# Patient Record
Sex: Female | Born: 1970
Health system: Southern US, Community
[De-identification: ages and names within clinical notes are randomized; demographics above are authoritative.]

## PROBLEM LIST (undated history)

## (undated) DIAGNOSIS — T8859XA Other complications of anesthesia, initial encounter: Secondary | ICD-10-CM

## (undated) DIAGNOSIS — Z9889 Other specified postprocedural states: Secondary | ICD-10-CM

## (undated) DIAGNOSIS — D242 Benign neoplasm of left breast: Secondary | ICD-10-CM

## (undated) DIAGNOSIS — S93439A Sprain of tibiofibular ligament of unspecified ankle, initial encounter: Secondary | ICD-10-CM

## (undated) DIAGNOSIS — O039 Complete or unspecified spontaneous abortion without complication: Secondary | ICD-10-CM

## (undated) DIAGNOSIS — T4145XA Adverse effect of unspecified anesthetic, initial encounter: Secondary | ICD-10-CM

## (undated) DIAGNOSIS — R112 Nausea with vomiting, unspecified: Secondary | ICD-10-CM

## (undated) DIAGNOSIS — M75102 Unspecified rotator cuff tear or rupture of left shoulder, not specified as traumatic: Secondary | ICD-10-CM

## (undated) DIAGNOSIS — N809 Endometriosis, unspecified: Secondary | ICD-10-CM

## (undated) DIAGNOSIS — N83209 Unspecified ovarian cyst, unspecified side: Secondary | ICD-10-CM

## (undated) DIAGNOSIS — T7840XA Allergy, unspecified, initial encounter: Secondary | ICD-10-CM

## (undated) HISTORY — PX: BREAST LUMPECTOMY: SHX2

## (undated) HISTORY — DX: Complete or unspecified spontaneous abortion without complication: O03.9

## (undated) HISTORY — DX: Benign neoplasm of left breast: D24.2

## (undated) HISTORY — DX: Endometriosis, unspecified: N80.9

## (undated) HISTORY — DX: Allergy, unspecified, initial encounter: T78.40XA

## (undated) HISTORY — DX: Unspecified ovarian cyst, unspecified side: N83.209

---

## 1992-08-23 HISTORY — PX: EXPLORATORY LAPAROTOMY: SUR591

## 1994-08-23 HISTORY — PX: OOPHORECTOMY: SHX86

## 1997-10-23 ENCOUNTER — Ambulatory Visit: Admission: RE | Admit: 1997-10-23 | Discharge: 1997-10-24 | Payer: Self-pay | Admitting: Obstetrics and Gynecology

## 1998-03-03 ENCOUNTER — Inpatient Hospital Stay (HOSPITAL_COMMUNITY): Admission: AD | Admit: 1998-03-03 | Discharge: 1998-03-03 | Payer: Self-pay | Admitting: Obstetrics & Gynecology

## 1998-10-02 ENCOUNTER — Ambulatory Visit (HOSPITAL_COMMUNITY): Admission: RE | Admit: 1998-10-02 | Discharge: 1998-10-02 | Payer: Self-pay | Admitting: Obstetrics and Gynecology

## 1998-11-24 ENCOUNTER — Other Ambulatory Visit: Admission: RE | Admit: 1998-11-24 | Discharge: 1998-11-24 | Payer: Self-pay | Admitting: Obstetrics and Gynecology

## 1999-10-09 ENCOUNTER — Other Ambulatory Visit: Admission: RE | Admit: 1999-10-09 | Discharge: 1999-10-09 | Payer: Self-pay | Admitting: Obstetrics and Gynecology

## 2000-02-18 ENCOUNTER — Inpatient Hospital Stay (HOSPITAL_COMMUNITY): Admission: AD | Admit: 2000-02-18 | Discharge: 2000-02-18 | Payer: Self-pay | Admitting: Obstetrics and Gynecology

## 2000-02-25 ENCOUNTER — Inpatient Hospital Stay (HOSPITAL_COMMUNITY): Admission: AD | Admit: 2000-02-25 | Discharge: 2000-02-25 | Payer: Self-pay | Admitting: Obstetrics & Gynecology

## 2000-03-03 ENCOUNTER — Inpatient Hospital Stay (HOSPITAL_COMMUNITY): Admission: AD | Admit: 2000-03-03 | Discharge: 2000-03-05 | Payer: Self-pay | Admitting: Obstetrics & Gynecology

## 2000-03-06 ENCOUNTER — Inpatient Hospital Stay (HOSPITAL_COMMUNITY): Admission: AD | Admit: 2000-03-06 | Discharge: 2000-03-06 | Payer: Self-pay | Admitting: Obstetrics and Gynecology

## 2000-03-10 ENCOUNTER — Encounter (INDEPENDENT_AMBULATORY_CARE_PROVIDER_SITE_OTHER): Payer: Self-pay

## 2000-03-10 ENCOUNTER — Inpatient Hospital Stay (HOSPITAL_COMMUNITY): Admission: AD | Admit: 2000-03-10 | Discharge: 2000-03-12 | Payer: Self-pay | Admitting: Obstetrics and Gynecology

## 2000-04-18 ENCOUNTER — Other Ambulatory Visit: Admission: RE | Admit: 2000-04-18 | Discharge: 2000-04-18 | Payer: Self-pay | Admitting: Obstetrics and Gynecology

## 2001-09-12 ENCOUNTER — Other Ambulatory Visit: Admission: RE | Admit: 2001-09-12 | Discharge: 2001-09-12 | Payer: Self-pay | Admitting: Obstetrics and Gynecology

## 2002-08-26 ENCOUNTER — Emergency Department (HOSPITAL_COMMUNITY): Admission: EM | Admit: 2002-08-26 | Discharge: 2002-08-26 | Payer: Self-pay | Admitting: *Deleted

## 2002-12-21 ENCOUNTER — Other Ambulatory Visit: Admission: RE | Admit: 2002-12-21 | Discharge: 2002-12-21 | Payer: Self-pay | Admitting: Obstetrics and Gynecology

## 2004-01-07 ENCOUNTER — Other Ambulatory Visit: Admission: RE | Admit: 2004-01-07 | Discharge: 2004-01-07 | Payer: Self-pay | Admitting: Obstetrics and Gynecology

## 2005-06-15 ENCOUNTER — Other Ambulatory Visit: Admission: RE | Admit: 2005-06-15 | Discharge: 2005-06-15 | Payer: Self-pay | Admitting: Obstetrics and Gynecology

## 2007-08-24 DIAGNOSIS — O039 Complete or unspecified spontaneous abortion without complication: Secondary | ICD-10-CM

## 2007-08-24 HISTORY — DX: Complete or unspecified spontaneous abortion without complication: O03.9

## 2007-12-05 ENCOUNTER — Ambulatory Visit (HOSPITAL_COMMUNITY): Admission: RE | Admit: 2007-12-05 | Discharge: 2007-12-05 | Payer: Self-pay | Admitting: Obstetrics and Gynecology

## 2007-12-05 ENCOUNTER — Encounter (INDEPENDENT_AMBULATORY_CARE_PROVIDER_SITE_OTHER): Payer: Self-pay | Admitting: Obstetrics and Gynecology

## 2008-03-19 ENCOUNTER — Inpatient Hospital Stay (HOSPITAL_COMMUNITY): Admission: AD | Admit: 2008-03-19 | Discharge: 2008-03-19 | Payer: Self-pay | Admitting: Obstetrics and Gynecology

## 2008-03-22 ENCOUNTER — Inpatient Hospital Stay (HOSPITAL_COMMUNITY): Admission: AD | Admit: 2008-03-22 | Discharge: 2008-03-22 | Payer: Self-pay | Admitting: Obstetrics and Gynecology

## 2008-03-25 ENCOUNTER — Inpatient Hospital Stay (HOSPITAL_COMMUNITY): Admission: AD | Admit: 2008-03-25 | Discharge: 2008-03-25 | Payer: Self-pay | Admitting: Obstetrics and Gynecology

## 2008-03-27 ENCOUNTER — Encounter (INDEPENDENT_AMBULATORY_CARE_PROVIDER_SITE_OTHER): Payer: Self-pay | Admitting: Obstetrics and Gynecology

## 2008-03-27 ENCOUNTER — Ambulatory Visit (HOSPITAL_COMMUNITY): Admission: RE | Admit: 2008-03-27 | Discharge: 2008-03-27 | Payer: Self-pay | Admitting: Obstetrics and Gynecology

## 2008-09-08 ENCOUNTER — Emergency Department (HOSPITAL_COMMUNITY): Admission: EM | Admit: 2008-09-08 | Discharge: 2008-09-08 | Payer: Self-pay | Admitting: Family Medicine

## 2010-08-23 HISTORY — PX: SHOULDER ARTHROSCOPY: SHX128

## 2011-01-05 NOTE — Op Note (Signed)
[name], Alexandra Mcguire              ACCOUNT NO.:  [id number]   MEDICAL RECORD NO.:  [id number]          PATIENT TYPE:  AMB   LOCATION:  SDC                           [facility]ILITY:  [facility]   PHYSICIAN:  Dineen Kid. Rana Snare, M.D.    DATE OF BIRTH:  [birth year]   DATE OF PROCEDURE:  12/05/2007  DATE OF DISCHARGE:                               OPERATIVE REPORT   PREOPERATIVE DIAGNOSIS:  Intrauterine pregnancy at six and a half weeks  gestational age with embryonic demise.   POSTOPERATIVE DIAGNOSIS:  Intrauterine pregnancy at six and a half weeks  gestational age with embryonic demise.   PROCEDURE:  Dilation and evacuation.   SURGEON:  Dr. Candice Camp.   ANESTHESIA:  Monitored anesthetic care and paracervical block.   INDICATIONS:  [name] is a 40 year old with an early pregnancy which  ultrasound last week showed irregular sac and slow heart rate.  Ultrasound yesterday shows embryonic demise measuring 6-1/2 weeks size.  She desires dilation and evacuation.  The risks and benefits of the  procedure were discussed at length.  Informed consent was obtained.  Her  blood type is O+.   DESCRIPTION OF PROCEDURE:  After adequate analgesia the patient was  placed in the dorsal lithotomy position.  She is sterilely prepped and  draped.  Bladder sterilely drained.  Graves speculum was placed.  Tenaculum placed in the anterior lip of the cervix.  Paracervical block  was placed with 1% Xylocaine and 1:100,000 epinephrine. A total of 20 mL  used.  The uterus sounded to 10 cm, easily dilated to a #29 Pratt  dilator. An 8 mm suction curette was inserted.  Products of conception  retrieved.  Curettage was performed until a gritty surface was felt  throughout the endometrial cavity and no further products of conception  being retrieved.  The patient received 0.2 mg of Methergine IM with good  uterine response.  Tenaculum removed from the cervix. Noted to be  hemostatic.  Speculum was then removed.  The patient  was transferred to  the recovery room in stable condition.  Sponge and instrument count was  normal x3.  Estimated blood loss was minimal.   DISPOSITION:  The patient will be discharged home.  She will follow-up  at the office in 2-3 weeks and had the  routine instruction sheet for  D&C.  Told to return for increased pain, fever or bleeding.  She is to  continue with Methergine 0.2 mg three times a day for 2 days and  doxycycline 100 mg p.o. b.i.d. times 7 days.      Dineen Kid Rana Snare, M.D.  Electronically Signed     DCL/MEDQ  D:  12/05/2007  T:  12/05/2007  Job:  [id number]

## 2011-01-05 NOTE — Op Note (Signed)
Alexandra Mcguire, Alexandra Mcguire              ACCOUNT NO.:  [id number]   MEDICAL RECORD NO.:  [id number]          PATIENT TYPE:  AMB   LOCATION:  SDC                           [facility]  [facility]   PHYSICIAN:  Guy Sandifer. Henderson Cloud, M.D. DATE OF BIRTH:  Jan 24, 1971   DATE OF PROCEDURE:  DATE OF DISCHARGE:                               OPERATIVE REPORT   PREOPERATIVE DIAGNOSIS:  Incomplete abortion.   POSTOPERATIVE DIAGNOSIS:  Incomplete abortion.   PROCEDURE:  Dilatation and evacuation.   SURGEON:  Guy Sandifer. Henderson Cloud, MD   ANESTHESIA:  MAC.   SPECIMENS:  Products of conception to pathology.   ESTIMATED BLOOD LOSS:  Minimal.   INDICATIONS AND CONSENT:  The patient is a 40 year old married white  female G4, P2 who had a recent miscarriage.  Details are dictated in  history and physical.  Dilatation and evacuation was discussed with the  patient.  Potential risks and complications were discussed  preoperatively including, but not limited to infection, uterine  perforation, organ damage, bleeding requiring transfusion of blood  products with possible HIV and hepatitis acquisition, DVT, PE, and  pneumonia.  All questions were answered and consent was signed on the  chart.   PROCEDURE:  The patient taken to the operating room where she was  identified, placed in dorsal supine position and given intravenous  sedation.  She was then placed in dorsal lithotomy position where she  was gently prepped, bladder straight catheterized and she was draped in  the sterile fashion.  Bivalve speculum was placed in the vagina.  The  anterior cervical lip was injected with 1% Xylocaine and grasped with  single-tooth tenaculum.  Paracervical block was placed at 2, 4, 5, 7, 8,  and 10 o'clock positions with approximately 20 mL total of 1% plain  Xylocaine.  Cervix was gently progressively dilated.  #7 curved curette  easily passes and suction curettage was carried out for minimal products  of conception.  Alternating  sharp and suction curettage was carried out.  The cavity was clean.  Good hemostasis was noted.  The patient was  awakened and taken to recovery room in stable condition.  All counts  were correct.  Blood type is O positive.  She will be discharged home on  Methergine 0.2 mg 1 p.o. t.i.d. for 6 doses.  Followup in the office in  2 weeks.      Guy Sandifer Henderson Cloud, M.D.  Electronically Signed     JET/MEDQ  D:  03/27/2008  T:  03/28/2008  Job:  [id number]

## 2011-01-05 NOTE — H&P (Signed)
NAMEKATALINA, Alexandra Mcguire              ACCOUNT NO.:  [id number]   MEDICAL RECORD NO.:  [id number]          PATIENT TYPE:  AMB   LOCATION:  SDC                           [facility]   PHYSICIAN:  Guy Sandifer. Henderson Cloud, M.D. DATE OF BIRTH:  30-Aug-1970   DATE OF ADMISSION:  DATE OF DISCHARGE:                              HISTORY & PHYSICAL   CHIEF COMPLAINT:  Incomplete abortion.   HISTORY OF PRESENT ILLNESS:  This patient is a 40 year old married white  female G4, P2 who had a plateaued quantitative hCG of approximately 12.  Ultrasound on February 28, 2008, was consistent with a probable small amount  of clot in the uterus.  However, her pregnancy test plateaued.  She was  then given methotrexate per protocol.  Her initial repeat was 117.  It  then went to 110 approximately 7 days later.  Repeat ultrasound at  [facility] on March 25, 2008, revealed a thickened endometrium of  3.2 cm with vascular flow, which was suggestive of retained products of  conception.  The options were discussed with the patient including a  second dose of methotrexate versus D&E.  The patient opts for surgical  intervention.  Potential risks and complications have been discussed  with the patient preoperatively.   PAST MEDICAL HISTORY:  Migraine headaches.   PAST SURGICAL HISTORY:  1. D&E.  2. Oophorectomy.  3. Laparoscopy for endometriosis.  4. Left breast lumpectomy.   OBSTETRIC HISTORY:  Cesarean section x1 and vaginal delivery x1.   MEDICATIONS:  Vitamins.   ALLERGIES:  SULFA DRUGS.   FAMILY HISTORY:  Positive for headaches, heart disease, seizure  disorder, diverticulosis, arthritis, and chronic hypertension.   SOCIAL HISTORY:  Denies tobacco, alcohol, or drug abuse.   REVIEW OF SYSTEMS:  NEUROLOGIC:  Denies headache.  CARDIO:  Denies chest  pain.  PULMONARY:  Denies shortness of breath.  GI:  Denies recent  changes in bowel habits.   PHYSICAL EXAMINATION:  VITAL SIGNS:  Height 5 feet 5 inches,  weight 159  pounds, and blood pressure 124/78.  HEENT:  Without thyromegaly.  LUNGS:  Clear to auscultation.  HEART:  Regular rate and rhythm.  ABDOMEN:  Soft and nontender without masses.  PELVIC:  Deferred.  EXTREMITIES:  Grossly within normal limits.  NEUROLOGICAL:  Grossly within normal limits.   LABORATORY DATA:  Blood type is O positive.   ASSESSMENT:  Incomplete abortion.   PLAN:  Dilatation and evacuation.      Guy Sandifer Henderson Cloud, M.D.  Electronically Signed     JET/MEDQ  D:  03/26/2008  T:  03/27/2008  Job:  [id number]

## 2011-01-08 NOTE — H&P (Signed)
[facility]  Patient:    Alexandra Mcguire, Alexandra Mcguire                       MRN: [id number] Adm. Date:  03/09/00 Attending:  Trevor Iha, M.D.                         History and Physical  DATE OF BIRTH:                1971-03-23  HISTORY OF PRESENT ILLNESS:   The patient is a 40 year old gravida 2, para 1 at 71-weeks estimated gestational age by an estimated date of confinement of April 20, 2000, who presents for cesarean section.  Her pregnancy has been complicated by fetal hydrocephalus.  She has been followed both by our office and also by the perinatologist at [facility].  Last ultrasound showed fetal head circumference of approximately 43 cm.  Dr. Jannetta Quint, perinatologist that has seen her most recently.  Recommended cesarean section for delivery.  He also recommended ________, preterm labor after 34 weeks.  She has been on terbutaline and bed rest for the last week due to preterm labor.  Cervix is 1 cm, 70% effaced.  She continues to contract four to five times an hour on the terbutaline, and now that she is 34 weeks, discontinue the terbutaline and will begin to labor.  Because of preterm labor beyond 34 weeks with recommendations not to stop fetal hydrocephalus compatible with the vaginal delivery, plan primary cesarean section.  Both the NICU at [facility] and [place] are ________.  The patient has had dexamethasone last week for fetal immaturity.  A group B Strep culture was negative on February 18, 2000.  PAST MEDICAL HISTORY:         Negative.  PAST SURGICAL HISTORY:        Significant for exploratory laparotomy for oophorectomy in 1996.  Laparoscopic _________ in 1994.  Minilaparotomy for right ovarian cyst in 1999 and a D&C in the year 2000.  PAST OB HISTORY:              Previous vaginal delivery, uncomplicated.  MEDICATIONS:                  Prenatal vitamins.  ALLERGIES:                    SULFA AND SHE IS SENSITIVE TO  MORPHINE.  PHYSICAL EXAMINATION:  VITAL SIGNS:                  Blood pressure is 110/80, heart regular rate and rhythm and clear to auscultation bilaterally.  ABDOMEN:                      Nondistended, nontender, gravid.  CERVIX:                       Is 1 cm, minus 3.  IMPRESSION AND PLAN:          Intrauterine pregnancy at 34-weeks, preterm labor.  Discontinue terbutaline.  The patient will continue to contract and because of progressive preterm labor, _______ with delivery fetal hydrocephalus, both NICUs are aware of this, and because of which, the fetal hydrocephalus and _________ at this point plan on delivery.  The patient also understands that products of conception may need to be _______ for infection for safe delivery of the  fetus.  She does give her informed consent. DD:  03/09/00 TD:  03/09/00 Job: [id number]

## 2011-01-08 NOTE — Discharge Summary (Signed)
[facility]  Patient:    Alexandra Mcguire, Alexandra Mcguire                     MRN: [id number] Adm. Date:  [id number] Disc. Date: [id number] Attending:  Trevor Iha Dictator:   Danie Chandler, R.N.                           Discharge Summary  ADMISSION DIAGNOSES:          1. Intrauterine pregnancy at [redacted] weeks gestation.                               2. Preterm labor.                               3. Fetal hydrocephalic.  DISCHARGE DIAGNOSES:          1. Intrauterine pregnancy at [redacted] weeks gestation.                               2. Preterm labor.                               3. Fetal hydrocephalic.  PROCEDURES:                   On March 10, 2000, primary low segment transverse cesarean section.  HISTORY OF PRESENT ILLNESS:   The patient is a 40 year old, gravida 2, para 1, at [redacted] weeks gestation.  The pregnancy has been complicated by fetal hydrocephalus, followed by perinatologist at [facility].  The last ultrasound last week showed a fetal head circumference of 43.0 cm.  A recommendation for cesarean section for delivery was made.  She subsequently was having preterm labor on tocolysis.  The cervical check was 1 cm dilated and 70% effaced.  The perinatologist recommended discontinuing the tocolysis at 34 weeks.  She continued to contract in labor.  Because of progressive preterm labor and fetal hydrocephalus, a primary cesarean section was planned.  HOSPITAL COURSE:              The patient was taken to the operating room and underwent the above-named procedure without complications.  This was productive of a viable female infant with fetal hydrocephalic with Apgars of 8 at one minute and 9 at five minutes.  Postoperatively, the patient did well. On postoperative day #1, the patients hemoglobin was 11.7, hematocrit 34.6, and white blood cell count 9.1.  She was ambulating well on this day and had good control with pain.  She had good return of bowel  function and was tolerating a regular diet.  The [facility] was stable at [facility].  DISPOSITION:                  On postoperative day #2, the patient was discharged home.  CONDITION ON DISCHARGE:       Good.  DIET:                         Regular as tolerated.  ACTIVITY:                     No heavy lifting,  no driving, and no vaginal entry.  FOLLOW-UP:                    She is to follow up in the office in one week. She is to call for temperature greater than 100 degrees, persistent nausea or vomiting, heavy vaginal bleeding, and/or redness or drainage from the incision site.  DISCHARGE MEDICATIONS: 1. Prenatal vitamins one p.o. q.d. 2. Tylox one to two p.o. q.4-6h. p.r.n. pain. DD:  03/30/00 TD:  03/31/00 Job: [id number]

## 2011-01-08 NOTE — Op Note (Signed)
[facility]  Patient:    Alexandra Mcguire, Alexandra Mcguire                     MRN: [id number] Proc. Date: 03/10/00 Adm. Date:  [id number] Attending:  Trevor Iha CC:         Trevor Iha, M.D.                           Operative Report  PREOPERATIVE DIAGNOSIS:       Intrauterine pregnancy at 34 weeks, preterm                               labor, fetal hydrocephalus.  POSTOPERATIVE DIAGNOSIS:      Intrauterine pregnancy at 34 weeks, preterm                               labor, fetal hydrocephalus.  PROCEDURE:                    Primary low segment transverse cesarean section.  SURGEON:                      Trevor Iha, M.D.  ASSISTANT:                    Raynald Kemp, M.D.  INDICATIONS:                  Ms. Fenn is a 40 year old G 2, P 1 at 34 weeks. (The pregnancy has been complicated by fetal hydrocephalus, followed by a perinatologist at [facility].  The last ultrasound last week showed a fetal head circumference of 43.0 cm.  Recommended a cesarean section for delivery.  She subsequently has been having preterm labor on tocolysis.  A cervical check was .0 cm, 70% effaced.  The perinatologist recommended discontinuing the tocolysis at 34 weeks.  She has continued to contract in labor.  Because of progressive preterm  labor and fetal hydrocephalus, plan a primary cesarean section.  Discussion of he risks and benefits of classical cesarean section versus low segment.  The patient gives her informed consent.  FINDINGS:                     A viable female infant with fetal hydrocephalus. We were able to deliver through a low segment transverse incision due to a well-developed lower segment.  Apgars were 8 and 9.  Weight was 7 pounds 11 ounces. The patient was stable to the neonatal intensive care unit.  The pH arterial was pending.  DESCRIPTION OF PROCEDURE:     After adequate analgesic the patient was placed in the supine  position with a left lateral tilt.  She was sterilely prepped and draped.  The Foley catheter was sterilely placed.  A Pfannenstiel skin incision was made through the previous incision, taken down sharply to the midline.  The fascia was transversely extended superiorly and inferiorly off the bellies of the rectus muscle, which were separated sharply in the midline.  The peritoneum was entered sharply.  The bladder flap was created and placed on the bladder blade.  The low segment was noted to be well-developed, due to the preterm labor and the fetal hydrocephalus.  A low semimyotomy incision was made to the  amniotic sac and extended laterally and the amniotomy was performed.  The infants vertex was easily delivered through the myotomy incision.  The nares and pharynx were suctioned. The infant was then easily delivered.  The cord was clamped, and the infant handed o the pediatricians.  The infant Apgars were 8 and 9.  The cord was clamped.  The  cord blood was then obtained and the placenta extracted manually.  The uterus was exteriorized and wiped clean with a dry lap.  The myotomy incision was closed in two layers, the first being a running locking layer, the second being an imbricating layer of #0 Monocryl.  Some bleeding over in the left inguinal, near the uterine artery was plicated with a Veress stitch, with care taken to avoid he ureters, with good hemostasis achieved.  There were some omental and epiploic adhesions to the right ovary which were sharply dissected with Bovie cautery. Hemostasis was achieved.  The uterus was placed back into the peritoneal cavity. After a copious amount of irrigated, adequate hemostasis was assured.  The peritoneum was closed with #0 Monocryl.  The rectus muscles were plicated in the midline.  Irrigation to the skin applied.  After adequate hemostasis, the fascia was closed with two sutures of #0 PDS in a running fashion.  The irrigation  was  applied after adequate hemostasis.  The skin was stapled and Steri-Strips applied.  The patient received 1 g of cefotetan after delivery of the placenta.  The infant was stable and transferred to the neonatal intensive care unit.  The estimated blood loss during the procedure was 800 cc. DD:  03/10/00 TD:  03/12/00 Job: [id number]

## 2011-01-08 NOTE — H&P (Signed)
Alexandra Mcguire  Patient:    Alexandra Mcguire, Alexandra Mcguire                     MRN: [id number] Adm. Date:  [id number] Attending:  Minette Headland                         History and Physical  HISTORY OF PRESENT ILLNESS:   Alexandra Mcguire is a 40 year old married female, gravida 2, para 1, at 33-2/[redacted] weeks gestation.  The patient is admitted for tocolysis due to preterm contractions.  The patient has been followed very carefully through our  office and through [facility] for fetal hydrocephaly.  She has been  stable to date, however, the patient did have an ultrasound on July 11, which revealed some worsening in the degree of hydrocephaly.  She is admitted today with contractions every eight minutes.  Cervical examination per nurse examiner was fingertip to 1 cm, 70% effacement, and -2 station.  She is admitted now for tocolysis of preterm contractions.  A consultation was carried out with the perineonatologist this morning regarding her care and tocolysis.  It is planned for this mother to have a cesarean delivery for her delivery and transfer the baby to [facility] where care will be undertaken  there for the baby.  Her recent Group B Strep status was negative.  PAST MEDICAL HISTORY:         None.  PAST SURGICAL HISTORY:        1) Exploratory laparotomy in 1996.  2) Laparoscopy for endometriosis x 1.  3) D&C x 1.  4) 1996 oophorectomy.  PAST OBSTETRIC HISTORY:       In 1997 a normal spontaneous vaginal delivery at term, female weighing 6 pounds 4 ounces without complications.  MEDICATIONS:                  1. Terbutaline.                               2. Prenatal vitamins.  ALLERGIES:                    No known drug allergies.  PHYSICAL EXAMINATION:  VITAL SIGNS:                  Stable, temperature 98.2, pulse 96, respirations 4, and blood pressure 131/73.  Fetal heart tones 140s and reassuring.  She does have frequent  contractions.  GENERAL:                      She is a well-developed, well-nourished female in no acute distress.  HEENT:                        Within normal limits.  NECK:                         Supple without adenopathy or thyromegaly.  HEART:                        Regular rate and rhythm without murmurs, rubs, or  gallops.  LUNGS:                        Clear to auscultation.  ABDOMEN:  Gravid and nontender.  EXTREMITIES: NEUROLOGICAL:    Grossly normal.  PELVIC:                       Cervical examination per nurse examiner fingertip to 1 cm, 60% effaced, and vertex presentation at -2 station.  ADMISSION DIAGNOSES:          1. Intrauterine pregnancy at 33-2/7 weeks.                               2. Preterm labor.                               3. Fetal hydrocephaly.  PLAN:                         1) IV magnesium sulfate tocolysis.  2) IV antibiotics.  3) Betamethasone x 2.  4) Maternal condition discussed with perineonatologist at [facility]. DD:  03/03/00 TD:  03/03/00 Job: [id number]

## 2011-04-15 ENCOUNTER — Other Ambulatory Visit (HOSPITAL_COMMUNITY): Payer: Self-pay | Admitting: Orthopedic Surgery

## 2011-04-15 DIAGNOSIS — M7540 Impingement syndrome of unspecified shoulder: Secondary | ICD-10-CM

## 2011-04-21 ENCOUNTER — Ambulatory Visit (HOSPITAL_COMMUNITY)
Admission: RE | Admit: 2011-04-21 | Discharge: 2011-04-21 | Disposition: A | Discharge status: Home / Self Care | Payer: 59 | Source: Ambulatory Visit | Attending: Orthopedic Surgery | Admitting: Orthopedic Surgery

## 2011-04-21 DIAGNOSIS — M25519 Pain in unspecified shoulder: Secondary | ICD-10-CM | POA: Insufficient documentation

## 2011-04-21 DIAGNOSIS — M7540 Impingement syndrome of unspecified shoulder: Secondary | ICD-10-CM

## 2011-04-21 DIAGNOSIS — M719 Bursopathy, unspecified: Secondary | ICD-10-CM | POA: Insufficient documentation

## 2011-04-21 DIAGNOSIS — M67919 Unspecified disorder of synovium and tendon, unspecified shoulder: Secondary | ICD-10-CM | POA: Insufficient documentation

## 2011-05-18 LAB — CBC
HCT: 40.8
Hemoglobin: 14.1
MCV: 95.8
Platelets: 251
RBC: 4.26
WBC: 8.6

## 2011-05-21 ENCOUNTER — Ambulatory Visit (HOSPITAL_BASED_OUTPATIENT_CLINIC_OR_DEPARTMENT_OTHER)
Admission: RE | Admit: 2011-05-21 | Discharge: 2011-05-21 | Disposition: A | Discharge status: Home / Self Care | Payer: 59 | Source: Ambulatory Visit | Attending: Orthopedic Surgery | Admitting: Orthopedic Surgery

## 2011-05-21 DIAGNOSIS — M719 Bursopathy, unspecified: Secondary | ICD-10-CM | POA: Insufficient documentation

## 2011-05-21 DIAGNOSIS — M67919 Unspecified disorder of synovium and tendon, unspecified shoulder: Secondary | ICD-10-CM | POA: Insufficient documentation

## 2011-05-21 DIAGNOSIS — M19019 Primary osteoarthritis, unspecified shoulder: Secondary | ICD-10-CM | POA: Insufficient documentation

## 2011-05-21 DIAGNOSIS — M25819 Other specified joint disorders, unspecified shoulder: Secondary | ICD-10-CM | POA: Insufficient documentation

## 2011-05-21 LAB — CBC
HCT: 38.7
Hemoglobin: 14.2
MCHC: 34.1
MCV: 98.5
Platelets: 201
RBC: 4.24
RBC: 3.93
WBC: 5.2
WBC: 6.9

## 2011-05-21 LAB — DIFFERENTIAL
Basophils Absolute: 0.1
Basophils Relative: 1
Lymphocytes Relative: 33
Monocytes Absolute: 0.5
Monocytes Relative: 9
Neutro Abs: 2.7
Neutrophils Relative %: 52

## 2011-05-21 LAB — CREATININE, SERUM: GFR calc Af Amer: >60

## 2011-05-21 LAB — HCG, QUANTITATIVE, PREGNANCY: hCG, Beta Chain, Quant, S: 110 — ABNORMAL HIGH

## 2011-05-24 LAB — POCT HEMOGLOBIN-HEMACUE: Hemoglobin: 15.4 g/dL — ABNORMAL HIGH (ref 12.0–15.0)

## 2011-05-28 NOTE — Op Note (Signed)
  Alexandra, Mcguire NO.:  [id number]  MEDICAL RECORD NO.:  [id number]  LOCATION:  [facility]                         FACILITY:  [facility]  PHYSICIAN:  Dyke Brackett, M.D.    DATE OF BIRTH:  1971/01/24  DATE OF PROCEDURE:  05/21/2011 DATE OF DISCHARGE:  04/21/2011                              OPERATIVE REPORT   INDICATIONS:  This is a 40 year old with severe shoulder pain, not responding to conservative treatment.  MRI demonstrating diffuse tendinopathy, severe impingement thought to be amenable to outpatient surgery.  PREOPERATIVE DIAGNOSES: 1. Severe tendinopathy, rotator cuff. 2. Degenerative tear in anterior-superior labrum. 3. Impingement. 4. Acromioclavicular joint arthritis.  POSTOPERATIVE DIAGNOSES: 1. Severe tendinopathy, rotator cuff. 2. Degenerative tear in anterior-superior labrum. 3. Impingement. 4. Acromioclavicular joint arthritis.  OPERATIONS: 1. Arthroscopic debridement, glenohumeral joint, right shoulder. 2. Arthroscopic acromioplasty, right shoulder. 3. Arthroscopic distal clavicle excision, right shoulder.  SURGEON:  Dyke Brackett, MD  ASSISTANT:  Margart Sickles, PA-C  ANESTHESIA:  General with a nerve block.  DESCRIPTION OF PROCEDURE:  She was examined under anesthesia.  She had normal range of motion.  She had no instability.  She was arthroscoped through posterolateral and anterior portal.  Systematic inspection of the shoulder showed diffuse tendinopathy involving all 3 tendons visualized with the cuff, subscapularis, supraspinatus, infraspinatus, and partial tearing of the supraspinatus was identified and debrided. The subscap showed an incomplete tear with partial tear of the anterior one-third.  We debrided this.  There still was attachment left though superiorly.  Biceps tendon anchor well-inflamed, did not show any overt instability.  We debrided the subscap.  We also had a degenerative tear in the anterior-superior labrum  which we debrided and there was no evidence of glenohumeral degenerative change.  Subacromial space was virtually inflamed.  Significant impingement was noted relative to the CA ligament and edge of the acromion.  We performed an acromioplasty resecting probably a total of 7-8 mm anterior ledge of the acromion.  We identified areas of impingement which we debrided on the cuff and also an arthritic AC joint, resecting approximately 1 cm of the distal clavicle.  This relieved the impingement nicely.  There was some tendinopathy type changes superiorly but no full-thickness tear was seen.  Bursectomy carried out, resection lines checked from the anterior and lateral approach.  Shoulder drained free of fluids, portals closed with nylon, no Marcaine was used in view of the block and sling was applied and taken to recovery room in stable condition.     Dyke Brackett, M.D.     WDC/MEDQ  D:  05/21/2011  T:  05/21/2011  Job:  [id number]  Electronically Signed by W. Texie Tupou M.D. on 05/28/2011 01:56:58 PM

## 2012-02-21 ENCOUNTER — Emergency Department (HOSPITAL_COMMUNITY): Payer: 59

## 2012-02-21 ENCOUNTER — Encounter (HOSPITAL_COMMUNITY): Payer: Self-pay | Admitting: Emergency Medicine

## 2012-02-21 ENCOUNTER — Ambulatory Visit (HOSPITAL_COMMUNITY)
Admission: EM | Admit: 2012-02-21 | Discharge: 2012-02-22 | Disposition: A | Discharge status: Home / Self Care | Payer: 59 | Source: Ambulatory Visit | Attending: Emergency Medicine | Admitting: Emergency Medicine

## 2012-02-21 ENCOUNTER — Encounter (HOSPITAL_COMMUNITY): Payer: Self-pay | Admitting: Anesthesiology

## 2012-02-21 ENCOUNTER — Encounter (HOSPITAL_COMMUNITY)
Admission: EM | Disposition: A | Discharge status: Home / Self Care | Payer: Self-pay | Source: Ambulatory Visit | Attending: Emergency Medicine

## 2012-02-21 ENCOUNTER — Emergency Department (HOSPITAL_COMMUNITY): Payer: 59 | Admitting: Anesthesiology

## 2012-02-21 DIAGNOSIS — S82409A Unspecified fracture of shaft of unspecified fibula, initial encounter for closed fracture: Secondary | ICD-10-CM | POA: Insufficient documentation

## 2012-02-21 DIAGNOSIS — Y998 Other external cause status: Secondary | ICD-10-CM | POA: Insufficient documentation

## 2012-02-21 DIAGNOSIS — W010XXA Fall on same level from slipping, tripping and stumbling without subsequent striking against object, initial encounter: Secondary | ICD-10-CM | POA: Insufficient documentation

## 2012-02-21 DIAGNOSIS — S9306XA Dislocation of unspecified ankle joint, initial encounter: Secondary | ICD-10-CM

## 2012-02-21 DIAGNOSIS — Y9302 Activity, running: Secondary | ICD-10-CM | POA: Insufficient documentation

## 2012-02-21 DIAGNOSIS — M25579 Pain in unspecified ankle and joints of unspecified foot: Secondary | ICD-10-CM | POA: Insufficient documentation

## 2012-02-21 DIAGNOSIS — S93439A Sprain of tibiofibular ligament of unspecified ankle, initial encounter: Secondary | ICD-10-CM | POA: Insufficient documentation

## 2012-02-21 HISTORY — PX: ORIF ANKLE FRACTURE: SHX5408

## 2012-02-21 HISTORY — DX: Sprain of tibiofibular ligament of unspecified ankle, initial encounter: S93.439A

## 2012-02-21 LAB — CARDIAC PANEL(CRET KIN+CKTOT+MB+TROPI)
CK, MB: 1.8 ng/mL (ref 0.3–4.0)
Relative Index: INVALID (ref 0.0–2.5)
Troponin I: <0.3 ng/mL (ref <0.30)

## 2012-02-21 SURGERY — OPEN REDUCTION INTERNAL FIXATION (ORIF) ANKLE FRACTURE
Anesthesia: General | Site: Ankle | Laterality: Right | Wound class: Clean

## 2012-02-21 MED ORDER — PROPOFOL 10 MG/ML IV EMUL
INTRAVENOUS | Status: AC | PRN
  Administered 2012-02-21: 6.5 mL via INTRAVENOUS

## 2012-02-21 MED ORDER — PROPOFOL 10 MG/ML IV EMUL
INTRAVENOUS | Status: DC | PRN
  Administered 2012-02-21: 120 mg via INTRAVENOUS

## 2012-02-21 MED ORDER — CALCIUM CARBONATE-VITAMIN D 500-200 MG-UNIT PO TABS
1.0000 | ORAL_TABLET | Freq: Every day | ORAL | Status: DC
  Administered 2012-02-22: 1 via ORAL
  Filled 2012-02-21 (×2): qty 1

## 2012-02-21 MED ORDER — ONDANSETRON HCL 4 MG/2ML IJ SOLN
4.0000 mg | Freq: Once | INTRAMUSCULAR | Status: DC
  Filled 2012-02-21: qty 2

## 2012-02-21 MED ORDER — SODIUM CHLORIDE 0.9 % IV BOLUS (SEPSIS)
1000.0000 mL | Freq: Once | INTRAVENOUS | Status: AC
  Administered 2012-02-21: 1000 mL via INTRAVENOUS

## 2012-02-21 MED ORDER — HYDROMORPHONE HCL PF 1 MG/ML IJ SOLN: 0.2500 mg | INTRAMUSCULAR | Status: DC | PRN

## 2012-02-21 MED ORDER — MEPERIDINE HCL 25 MG/ML IJ SOLN
6.2500 mg | INTRAMUSCULAR | Status: DC | PRN
  Administered 2012-02-21: 12.5 mg via INTRAVENOUS

## 2012-02-21 MED ORDER — HYDROMORPHONE HCL PF 1 MG/ML IJ SOLN
INTRAMUSCULAR | Status: AC
  Administered 2012-02-21: 1 mg via INTRAVENOUS
  Filled 2012-02-21: qty 1

## 2012-02-21 MED ORDER — LACTATED RINGERS IV SOLN
INTRAVENOUS | Status: DC | PRN
  Administered 2012-02-21: 19:00:00 via INTRAVENOUS

## 2012-02-21 MED ORDER — CEFAZOLIN SODIUM-DEXTROSE 2-3 GM-% IV SOLR
INTRAVENOUS | Status: AC
  Filled 2012-02-21: qty 50

## 2012-02-21 MED ORDER — FENTANYL CITRATE 0.05 MG/ML IJ SOLN
INTRAMUSCULAR | Status: DC | PRN
  Administered 2012-02-21 (×3): 50 ug via INTRAVENOUS
  Administered 2012-02-21: 100 ug via INTRAVENOUS

## 2012-02-21 MED ORDER — SENNA 8.6 MG PO TABS
1.0000 | ORAL_TABLET | Freq: Two times a day (BID) | ORAL | Status: DC
  Administered 2012-02-22 (×2): 8.6 mg via ORAL
  Filled 2012-02-21 (×3): qty 1

## 2012-02-21 MED ORDER — ONDANSETRON HCL 4 MG/2ML IJ SOLN
INTRAMUSCULAR | Status: DC | PRN
  Administered 2012-02-21: 4 mg via INTRAVENOUS

## 2012-02-21 MED ORDER — METHOCARBAMOL 100 MG/ML IJ SOLN
500.0000 mg | Freq: Four times a day (QID) | INTRAVENOUS | Status: DC | PRN
  Filled 2012-02-21: qty 5

## 2012-02-21 MED ORDER — ADULT MULTIVITAMIN W/MINERALS CH
1.0000 | ORAL_TABLET | Freq: Every day | ORAL | Status: DC
  Administered 2012-02-22: 1 via ORAL
  Filled 2012-02-21 (×2): qty 1

## 2012-02-21 MED ORDER — FENTANYL CITRATE 0.05 MG/ML IJ SOLN
100.0000 ug | Freq: Once | INTRAMUSCULAR | Status: AC
  Administered 2012-02-21: 100 ug via INTRAVENOUS
  Filled 2012-02-21: qty 2

## 2012-02-21 MED ORDER — PROPOFOL 10 MG/ML IV BOLUS
0.5000 mg/kg | Freq: Once | INTRAVENOUS | Status: AC
  Administered 2012-02-21: 65 mg via INTRAVENOUS
  Filled 2012-02-21: qty 3.45

## 2012-02-21 MED ORDER — LORATADINE-PSEUDOEPHEDRINE ER 5-120 MG PO TB12: 1.0000 | ORAL_TABLET | Freq: Two times a day (BID) | ORAL | Status: DC | PRN

## 2012-02-21 MED ORDER — LIDOCAINE HCL (CARDIAC) 20 MG/ML IV SOLN
INTRAVENOUS | Status: DC | PRN
  Administered 2012-02-21: 100 mg via INTRAVENOUS

## 2012-02-21 MED ORDER — HYDROMORPHONE HCL PF 1 MG/ML IJ SOLN
1.0000 mg | Freq: Once | INTRAMUSCULAR | Status: AC
  Administered 2012-02-21: 1 mg via INTRAVENOUS
  Filled 2012-02-21: qty 1

## 2012-02-21 MED ORDER — HYDROMORPHONE HCL PF 1 MG/ML IJ SOLN
0.5000 mg | INTRAMUSCULAR | Status: DC | PRN
  Administered 2012-02-22: 1 mg via INTRAVENOUS
  Filled 2012-02-21: qty 1

## 2012-02-21 MED ORDER — PROMETHAZINE HCL 25 MG PO TABS: 25.0000 mg | ORAL_TABLET | Freq: Four times a day (QID) | ORAL | Status: DC | PRN

## 2012-02-21 MED ORDER — ONDANSETRON HCL 4 MG/2ML IJ SOLN: 4.0000 mg | Freq: Four times a day (QID) | INTRAMUSCULAR | Status: DC | PRN

## 2012-02-21 MED ORDER — ONDANSETRON HCL 4 MG/2ML IJ SOLN
4.0000 mg | Freq: Once | INTRAMUSCULAR | Status: AC
  Administered 2012-02-21: 4 mg via INTRAVENOUS

## 2012-02-21 MED ORDER — DEXAMETHASONE SODIUM PHOSPHATE 10 MG/ML IJ SOLN
INTRAMUSCULAR | Status: DC | PRN
  Administered 2012-02-21: 8 mg via INTRAVENOUS

## 2012-02-21 MED ORDER — CEFAZOLIN SODIUM-DEXTROSE 2-3 GM-% IV SOLR
2.0000 g | INTRAVENOUS | Status: AC
  Administered 2012-02-21: 2 g via INTRAVENOUS

## 2012-02-21 MED ORDER — MEPERIDINE HCL 25 MG/ML IJ SOLN
INTRAMUSCULAR | Status: AC
  Filled 2012-02-21: qty 1

## 2012-02-21 MED ORDER — ONDANSETRON HCL 4 MG PO TABS: 4.0000 mg | ORAL_TABLET | Freq: Four times a day (QID) | ORAL | Status: DC | PRN

## 2012-02-21 MED ORDER — SCOPOLAMINE 1 MG/3DAYS TD PT72
MEDICATED_PATCH | TRANSDERMAL | Status: DC | PRN
  Administered 2012-02-21: 1 via TRANSDERMAL

## 2012-02-21 MED ORDER — SUCCINYLCHOLINE CHLORIDE 20 MG/ML IJ SOLN
INTRAMUSCULAR | Status: DC | PRN
  Administered 2012-02-21: 100 mg via INTRAVENOUS

## 2012-02-21 MED ORDER — SODIUM CHLORIDE 0.9 % IV SOLN
INTRAVENOUS | Status: DC | PRN
  Administered 2012-02-21: 18:00:00 via INTRAVENOUS

## 2012-02-21 MED ORDER — METHOCARBAMOL 500 MG PO TABS
500.0000 mg | ORAL_TABLET | Freq: Four times a day (QID) | ORAL | Status: DC | PRN
  Administered 2012-02-22: 500 mg via ORAL
  Filled 2012-02-21 (×2): qty 1

## 2012-02-21 MED ORDER — OXYCODONE-ACETAMINOPHEN 5-325 MG PO TABS
1.0000 | ORAL_TABLET | ORAL | Status: DC | PRN
  Administered 2012-02-22: 1 via ORAL
  Filled 2012-02-21: qty 1

## 2012-02-21 MED ORDER — OXYCODONE HCL 5 MG PO TABS: 5.0000 mg | ORAL_TABLET | ORAL | Status: DC | PRN

## 2012-02-21 MED ORDER — DOCUSATE SODIUM 100 MG PO CAPS
100.0000 mg | ORAL_CAPSULE | Freq: Two times a day (BID) | ORAL | Status: DC
  Administered 2012-02-22 (×2): 100 mg via ORAL
  Filled 2012-02-21 (×3): qty 1

## 2012-02-21 MED ORDER — ONDANSETRON HCL 4 MG/2ML IJ SOLN
INTRAMUSCULAR | Status: AC
  Administered 2012-02-21: 4 mg via INTRAVENOUS
  Filled 2012-02-21: qty 2

## 2012-02-21 MED ORDER — METOCLOPRAMIDE HCL 5 MG/ML IJ SOLN: 5.0000 mg | Freq: Three times a day (TID) | INTRAMUSCULAR | Status: DC | PRN

## 2012-02-21 MED ORDER — MIDAZOLAM HCL 5 MG/5ML IJ SOLN
INTRAMUSCULAR | Status: DC | PRN
  Administered 2012-02-21: 2 mg via INTRAVENOUS

## 2012-02-21 MED ORDER — LORATADINE 10 MG PO TABS
10.0000 mg | ORAL_TABLET | Freq: Every day | ORAL | Status: DC
  Filled 2012-02-21 (×2): qty 1

## 2012-02-21 MED ORDER — ONDANSETRON HCL 4 MG/2ML IJ SOLN: 4.0000 mg | Freq: Once | INTRAMUSCULAR | Status: DC | PRN

## 2012-02-21 MED ORDER — SODIUM CHLORIDE 0.9 % IV SOLN: INTRAVENOUS | Status: DC

## 2012-02-21 MED ORDER — ZOLPIDEM TARTRATE 5 MG PO TABS: 5.0000 mg | ORAL_TABLET | Freq: Every evening | ORAL | Status: DC | PRN

## 2012-02-21 MED ORDER — OXYCODONE-ACETAMINOPHEN 10-325 MG PO TABS: 1.0000 | ORAL_TABLET | Freq: Four times a day (QID) | ORAL | Status: AC | PRN

## 2012-02-21 MED ORDER — BUPIVACAINE-EPINEPHRINE PF 0.5-1:200000 % IJ SOLN
INTRAMUSCULAR | Status: DC | PRN
  Administered 2012-02-21: 30 mL

## 2012-02-21 MED ORDER — PSEUDOEPHEDRINE HCL ER 120 MG PO TB12
120.0000 mg | ORAL_TABLET | Freq: Two times a day (BID) | ORAL | Status: DC
  Filled 2012-02-21 (×3): qty 1

## 2012-02-21 MED ORDER — METOCLOPRAMIDE HCL 10 MG PO TABS: 5.0000 mg | ORAL_TABLET | Freq: Three times a day (TID) | ORAL | Status: DC | PRN

## 2012-02-21 MED ORDER — CEFAZOLIN SODIUM 1-5 GM-% IV SOLN
1.0000 g | Freq: Four times a day (QID) | INTRAVENOUS | Status: AC
  Administered 2012-02-21 – 2012-02-22 (×3): 1 g via INTRAVENOUS
  Filled 2012-02-21 (×3): qty 50

## 2012-02-21 SURGICAL SUPPLY — 53 items
APL SKNCLS STERI-STRIP NONHPOA (GAUZE/BANDAGES/DRESSINGS) ×1
BANDAGE ELASTIC 4 VELCRO ST LF (GAUZE/BANDAGES/DRESSINGS) ×1 IMPLANT
BANDAGE ELASTIC 6 VELCRO ST LF (GAUZE/BANDAGES/DRESSINGS) ×4 IMPLANT
BANDAGE ESMARK 6X9 LF (GAUZE/BANDAGES/DRESSINGS) IMPLANT
BANDAGE GAUZE ELAST BULKY 4 IN (GAUZE/BANDAGES/DRESSINGS) ×1 IMPLANT
BENZOIN TINCTURE PRP APPL 2/3 (GAUZE/BANDAGES/DRESSINGS) ×1 IMPLANT
BIT DRILL 2.5X110 QC LCP DISP (BIT) ×1 IMPLANT
BNDG CMPR 9X6 STRL LF SNTH (GAUZE/BANDAGES/DRESSINGS)
BNDG COHESIVE 3X5 TAN STRL LF (GAUZE/BANDAGES/DRESSINGS) ×1 IMPLANT
BNDG ESMARK 6X9 LF (GAUZE/BANDAGES/DRESSINGS)
BOOTCOVER CLEANROOM LRG (PROTECTIVE WEAR) ×4 IMPLANT
CLOTH BEACON ORANGE TIMEOUT ST (SAFETY) ×2 IMPLANT
CLSR STERI-STRIP ANTIMIC 1/2X4 (GAUZE/BANDAGES/DRESSINGS) ×1 IMPLANT
COVER SURGICAL LIGHT HANDLE (MISCELLANEOUS) ×2 IMPLANT
CUFF TOURNIQUET SINGLE 34IN LL (TOURNIQUET CUFF) IMPLANT
CUFF TOURNIQUET SINGLE 44IN (TOURNIQUET CUFF) IMPLANT
DECANTER SPIKE VIAL GLASS SM (MISCELLANEOUS) IMPLANT
DRAPE C-ARM 42X72 X-RAY (DRAPES) IMPLANT
DRAPE OEC MINIVIEW 54X84 (DRAPES) ×1 IMPLANT
DRAPE U-SHAPE 47X51 STRL (DRAPES) IMPLANT
DRSG PAD ABDOMINAL 8X10 ST (GAUZE/BANDAGES/DRESSINGS) ×2 IMPLANT
DURAPREP 26ML APPLICATOR (WOUND CARE) ×2 IMPLANT
ELECT REM PT RETURN 9FT ADLT (ELECTROSURGICAL) ×2
ELECTRODE REM PT RTRN 9FT ADLT (ELECTROSURGICAL) ×1 IMPLANT
GAUZE XEROFORM 1X8 LF (GAUZE/BANDAGES/DRESSINGS) ×4 IMPLANT
GLOVE BIOGEL PI IND STRL 8 (GLOVE) ×1 IMPLANT
GLOVE BIOGEL PI INDICATOR 8 (GLOVE) ×1
GLOVE ORTHO TXT STRL SZ7.5 (GLOVE) ×2 IMPLANT
GLOVE SURG ORTHO 8.0 STRL STRW (GLOVE) ×4 IMPLANT
GOWN STRL REIN XL XLG (GOWN DISPOSABLE) ×4 IMPLANT
KIT 1/3 TUB PL 5H 61M (Orthopedic Implant) IMPLANT
KIT BASIN OR (CUSTOM PROCEDURE TRAY) ×2 IMPLANT
KIT ROOM TURNOVER OR (KITS) ×2 IMPLANT
MANIFOLD NEPTUNE II (INSTRUMENTS) ×2 IMPLANT
NS IRRIG 1000ML POUR BTL (IV SOLUTION) ×2 IMPLANT
PACK ORTHO EXTREMITY (CUSTOM PROCEDURE TRAY) ×2 IMPLANT
PAD ARMBOARD 7.5X6 YLW CONV (MISCELLANEOUS) ×4 IMPLANT
PAD CAST 4YDX4 CTTN HI CHSV (CAST SUPPLIES) ×2 IMPLANT
PADDING CAST COTTON 4X4 STRL (CAST SUPPLIES) ×2
PROS 1/3 TUB PL 5H 61M (Orthopedic Implant) ×2 IMPLANT
SCREW CORTEX 3.5 50MM (Screw) ×1 IMPLANT
SPONGE GAUZE 4X4 12PLY (GAUZE/BANDAGES/DRESSINGS) ×2 IMPLANT
SPONGE LAP 4X18 X RAY DECT (DISPOSABLE) ×4 IMPLANT
STAPLER VISISTAT 35W (STAPLE) ×2 IMPLANT
SUCTION FRAZIER TIP 10 FR DISP (SUCTIONS) ×2 IMPLANT
SUT VIC AB 3-0 SH 8-18 (SUTURE) ×1 IMPLANT
SYR CONTROL 10ML LL (SYRINGE) ×1 IMPLANT
TOWEL OR 17X24 6PK STRL BLUE (TOWEL DISPOSABLE) ×2 IMPLANT
TOWEL OR 17X26 10 PK STRL BLUE (TOWEL DISPOSABLE) ×2 IMPLANT
TUBE CONNECTING 12X1/4 (SUCTIONS) ×2 IMPLANT
UNDERPAD 30X30 INCONTINENT (UNDERPADS AND DIAPERS) ×2 IMPLANT
WATER STERILE IRR 1000ML POUR (IV SOLUTION) ×2 IMPLANT
YANKAUER SUCT BULB TIP NO VENT (SUCTIONS) ×1 IMPLANT

## 2012-02-21 NOTE — ED Notes (Signed)
Sedation end. Pt arouses to calling. VSS

## 2012-02-21 NOTE — Op Note (Signed)
02/21/2012  8:09 PM  PATIENT:  Alexandra Mcguire    PRE-OPERATIVE DIAGNOSIS:  right ankle dislocation  POST-OPERATIVE DIAGNOSIS:  Same  PROCEDURE:  OPEN REDUCTION INTERNAL FIXATION (ORIF) ANKLE SYNDESMOSIS  SURGEON:  Eulas Post, MD  PHYSICIAN ASSISTANT: Janace Litten, OPA-C, present and scrubbed throughout the case, critical for completion in a timely fashion, and for retraction, instrumentation, and closure.  ANESTHESIA:   General  PREOPERATIVE INDICATIONS:  Alexandra Mcguire is a  41 y.o. female with a diagnosis of right ankle dislocation who elected for surgical management.  She had skin tenting, and nearly an open fracture.  The risks benefits and alternatives were discussed with the patient preoperatively including but not limited to the risks of infection, bleeding, nerve injury, cardiopulmonary complications, the need for revision surgery, among others, and the patient was willing to proceed.  OPERATIVE IMPLANTS: Synthes 2 hole one third tubular plate, cut down from a 5 hole plate, with 2  3.5 mm cortical screws, placed across 3 cortices  OPERATIVE FINDINGS: Disrupted syndesmosis with widening of the medial clear space and distal tibiofibular overlap. The fibula was out to length, compared to the contralateral side.  OPERATIVE PROCEDURE: The patient was brought to the operating room and placed in supine position. General anesthesia was administered. Regional block and given. Time out was performed. The right lower extremity was prepped and draped in usual sterile fashion. Time out was performed. The leg was elevated and exsanguinated and the tourniquet was inflated. I did use the C-arm to take an x-ray of the contralateral side for comparison. This was prior to prepping and draping.  I made a small incision over the distal fibula, and exposed the bone. I applied a largeclamp, reducing the syndesmosis anatomically. I then cut a small plate down to 2 holes, because a 2 hole plate  was not available, and then filed off the ends in order that there were no sharp ends. I then placed a distal cortical screw across 3 cortices, and then placed a second one for back up given the severe ligamentous damage. I then tested the syndesmosis, which was stable. I then irrigated the wounds copiously, and repaired the subcutaneous tissue with Vicryl followed by Steri-Strips and sterile gauze for the skin. Final C-arm pictures were taken. A posterior splint was applied. She was awakened and returned to the PACU in stable and satisfactory condition. There were no complications and she tolerated the procedure well.

## 2012-02-21 NOTE — ED Notes (Signed)
Pt was running on a wet trail when she slipped and snapped her right ankle. Obvious deformity. Distal circulation intact. 20g l hand, fentanyl PTA

## 2012-02-21 NOTE — Discharge Instructions (Signed)
Ankle Dislocation Ankle dislocation is the displacement of the bones that form your ankle joint. The ankle joint is designed for a balance of stability and flexibility. The bones of the ankle are held in place by very strong, fibrous tissues (ligaments) that connect the bones to each other. CAUSES Because the ankle is a very strong and stable joint, ankle dislocation is only caused by a very forceful injury. Typically, injuries that contribute to ankle dislocation include broken bones (fractures) on the inside and outside of the ankle (malleoli).  RELATED COMPLICATIONS Ankle dislocation can lead to more serious complications. Examples of complications associated with ankle dislocation include:  Injury to the strong fibrous tissues that connect muscles to bones (tendons).   Injury to the flexible tissue that cushions the bones in the joint (cartilage). This can lead to the development of arthritis, loss of joint motion, and pain.   Injury to the nerves and blood vessels that cross the ankle. Blood vessel damage may result in bone death of the top bone of the foot (talus).   Skin over the dislocated area being torn (lacerated) or damaged by pressure from the dislocated bones.   Swelling of compartments in the foot (rare). This may damage blood supply to the muscles (compartment syndrome).  RISK FACTORS Although dislocation of the ankle can occur in anyone, some people are at greater risk than others. People at increased risk of ankle dislocation include:  Young males. This may be related to their overall increased risk of injury.   Postmenopausal women. This may be related to their increased risk of bone fracture because of the weakening of the bones that occurs in women in this age group (osteoporosis).   People born with greater looseness (elasticity) in their ligaments.  SYMPTOMS Symptoms of ankle dislocation include:  Severe pain.   Swelling.   Deformity around the ankle.    Whitening or laceration of the skin.  DIAGNOSIS  A physical exam and an X-ray exam are usually done to help your caregiver diagnose ankle dislocation. TREATMENT Treatment may include:  Manipulation of the ankle by your caregiver to put your ankle back in place (reduction).   Repair of any associated skin lacerations.   Plates and screws used to stabilize the fractures and hold the joint in position after reduction.   Pins drilled into your bones that are connected to bars outside of your skin (external fixator) used to hold your ankle in a fixed position until the swelling in your ankle goes down enough for surgery to be done.   Placement of a cast or splint to allow torn ligaments to heal.   Physical therapy to regain ankle motion and leg strength.  HOME CARE INSTRUCTIONS The following measures can help to reduce pain and hasten the healing process:  Rest your injured joint. Do not move it. Avoid activities similar to the one that caused your injury.   Apply ice to your injured joint for 1 to 2 days after your reduction or as directed by your caregiver. Applying ice helps to reduce inflammation and pain.   Put ice in a plastic bag.   Place a towel between your skin and the bag.   Leave the ice on for 15 to 20 minutes at a time, every couple of hours while you are awake.   Elevate your ankle above your heart to minimize swelling.   Move your toes as instructed by your caregiver to prevent stiffness.   Take over-the-counter or prescription medicines for  pain as directed by your caregiver.  SEEK IMMEDIATE MEDICAL CARE IF:  Your cast, splint, screws, plates, or external fixator becomes loose or damaged.   You have an external fixator and you notice fluids draining around the pins.   Your pain becomes worse rather than better.   You lose feeling in your toe or cannot bend the tip of your toe.  MAKE SURE YOU:  Understand these instructions.   Will watch your condition.    Will get help right away if you are not doing well or get worse.  Document Released: 08/09/2005 Document Revised: 07/29/2011 Document Reviewed: 01/07/2011 [facility] Patient Information 2012 [place], [place].

## 2012-02-21 NOTE — ED Provider Notes (Signed)
History     CSN: [id number]  Arrival date & time 02/21/12  1147   First MD Initiated Contact with Patient 02/21/12 1159      Chief Complaint  Patient presents with  . Ankle Pain    (Consider location/radiation/quality/duration/timing/severity/associated sxs/prior treatment) HPI Comments: Patient sustained mechanical fall while she was running and has a deformity to her right ankle. She is unable to bear weight. Her distal pulses intact. Is able to wiggle her toes. She denies hitting head or losing consciousness. She denies any other injuries.  The history is provided by the patient and the EMS personnel.    History reviewed. No pertinent past medical history.  History reviewed. No pertinent past surgical history.  History reviewed. No pertinent family history.  History  Substance Use Topics  . Smoking status: Not on file  . Smokeless tobacco: Not on file  . Alcohol Use: Not on file    OB History    Grav Para Term Preterm Abortions TAB SAB Ect Mult Living                  Review of Systems  Constitutional: Negative for activity change and appetite change.  Eyes: Negative for visual disturbance.  Respiratory: Negative for cough, chest tightness and shortness of breath.   Cardiovascular: Negative for chest pain.  Gastrointestinal: Negative for nausea, vomiting and abdominal pain.  Genitourinary: Negative for dysuria, vaginal bleeding and vaginal discharge.  Musculoskeletal: Positive for myalgias, joint swelling, arthralgias and gait problem. Negative for back pain.  Skin: Negative for rash.  Neurological: Negative for dizziness and headaches.    Allergies  Sulfa antibiotics  Home Medications   Current Outpatient Rx  Name Route Sig Dispense Refill  . CALCIUM CARBONATE-VITAMIN D 500-200 MG-UNIT PO TABS Oral Take 1 tablet by mouth daily.    . OMEGA-3 FATTY ACIDS 1000 MG PO CAPS Oral Take 1 g by mouth daily.    Marland Kitchen LORATADINE-PSEUDOEPHEDRINE ER 5-120 MG PO TB12 Oral  Take 1 tablet by mouth 2 (two) times daily as needed. For seasonal allergies    . ADULT MULTIVITAMIN W/MINERALS CH Oral Take 1 tablet by mouth daily.    Marland Kitchen NAPROXEN 250 MG PO TABS Oral Take 500 mg by mouth daily as needed. For pain      BP 107/74  Pulse 76  Temp 98 F (36.7 C) (Oral)  Resp 20  Wt 152 lb (68.947 kg)  SpO2 99%  LMP 02/15/2012  Physical Exam  Constitutional: She is oriented to person, place, and time. She appears well-developed and well-nourished. No distress.  HENT:  Head: Normocephalic and atraumatic.  Mouth/Throat: Oropharynx is clear and moist.  Eyes: Conjunctivae are normal. Pupils are equal, round, and reactive to light.  Neck: Normal range of motion. Neck supple.  Cardiovascular: Normal rate, regular rhythm and normal heart sounds.   No murmur heard. Pulmonary/Chest: Effort normal and breath sounds normal. No respiratory distress.  Abdominal: Soft. There is no tenderness. There is no rebound and no guarding.  Musculoskeletal: She exhibits tenderness.       Right ankle with valgus deformity, abrasion over the medial malleolus. +2 DPPT pulses, able to wiggle toes  Neurological: She is alert and oriented to person, place, and time. No cranial nerve deficit.  Skin: Skin is warm.    ED Course  Procedural sedation Date/Time: 02/21/2012 12:47 PM Performed by: Glynn Octave Authorized by: Glynn Octave Consent: Verbal consent obtained. Written consent obtained. Risks and benefits: risks, benefits and alternatives were  discussed Consent given by: patient and spouse Patient understanding: patient states understanding of the procedure being performed Patient consent: the patient's understanding of the procedure matches consent given Patient identity confirmed: verbally with patient Time out: Immediately prior to procedure a "time out" was called to verify the correct patient, procedure, equipment, support staff and site/side marked as required. Local anesthesia  used: no Patient sedated: yes Sedation type: moderate (conscious) sedation Sedatives: propofol Analgesia: hydromorphone Vitals: Vital signs were monitored during sedation. Patient tolerance: Patient tolerated the procedure well with no immediate complications.  Reduction of fracture Date/Time: 02/21/2012 12:48 PM Performed by: Glynn Octave Authorized by: Glynn Octave Consent: Verbal consent obtained. Written consent obtained. Risks and benefits: risks, benefits and alternatives were discussed Consent given by: patient and spouse Patient understanding: patient states understanding of the procedure being performed Patient consent: the patient's understanding of the procedure matches consent given Procedure consent: procedure consent matches procedure scheduled Patient identity confirmed: verbally with patient Time out: Immediately prior to procedure a "time out" was called to verify the correct patient, procedure, equipment, support staff and site/side marked as required. Local anesthesia used: no Patient sedated: yes Sedation type: moderate (conscious) sedation Sedatives: propofol Analgesia: hydromorphone Vitals: Vital signs were monitored during sedation. Patient tolerance: Patient tolerated the procedure well with no immediate complications.   (including critical care time)   Labs Reviewed  CARDIAC PANEL(CRET KIN+CKTOT+MB+TROPI)   Dg Tibia/fibula Right  02/21/2012  *RADIOLOGY REPORT*  Clinical Data: Injured right leg.  RIGHT TIBIA AND FIBULA - 2 VIEW  Comparison:  None  Findings:  There is a relatively nondisplaced but comminuted fracture at the middle third distal third junction region of the fibular shaft.  No tibial fracture is identified.  IMPRESSION: Mid distal fibular shaft fracture.  Original Report Authenticated By: P. Loralie Champagne, M.D.   Dg Ankle Complete Right  02/21/2012  *RADIOLOGY REPORT*  Clinical Data: Ankle pain  RIGHT ANKLE - COMPLETE 3+ VIEW   Comparison: None.  Findings: Three views of the right ankle submitted.  There is mild displaced comminuted fracture distal shaft of the right fibula. There is disruption of the ankle mortise with medial displacement of the distal right tibia and increased space between the medial malleolus and medial aspect of the talus.  IMPRESSION:  There is mild displaced comminuted fracture distal shaft of the right fibula.  There is disruption of the ankle mortise with medial displacement of the distal right tibia and increased space between the medial malleolus and medial aspect of the talus.  Original Report Authenticated By: Natasha Mead, M.D.     No diagnosis found.    MDM  Fracture dislocation of right ankle is closed. Vital signs stable.  Will attempt reduction with conscious sedation.  Fracture dislocation reduced as above.  Comminuted midshaft fibula fracture with disrupted ankle mortise. Tibia continues to translate medially and reduction difficult to maintain. D/w PA for Dr. Thurston Hole who is on call for [facility].  Dr. Dion Saucier to see patient and admit for ORIF.     Glynn Octave, MD 02/21/12 [phone]

## 2012-02-21 NOTE — H&P (Signed)
PREOPERATIVE H&P  Chief Complaint: Right ankle fracture dislocation  HPI: Alexandra Mcguire is a 41 y.o. female who presents after running today and tripping while doing trail running. She had an acute fracture dislocation of her right ankle. She was seen in the emergency room, an emergent close adduction was performed. She complains acute severe pain over her right ankle, and had no other injuries in the fall. The reduction has improved the pain, but she still has substantial medial pain. She also denies any other injuries. 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  History reviewed. No pertinent past medical history. History reviewed. No pertinent past surgical history. History   Social History  . Marital Status: Married    Spouse Name: N/A    Number of Children: N/A  . Years of Education: N/A   Social History Main Topics  . Smoking status: None  . Smokeless tobacco: None  . Alcohol Use: None  . Drug Use: None  . Sexually Active: None   Other Topics Concern  . None   Social History Narrative  . None   History reviewed. No pertinent family history. Allergies  Allergen Reactions  . Sulfa Antibiotics Rash   Father died in his 25s of a sarcoma.  Prior to Admission medications   Medication Sig Start Date End Date Taking? Authorizing Provider  calcium-vitamin D (OSCAL WITH D) 500-200 MG-UNIT per tablet Take 1 tablet by mouth daily.   Yes Historical Provider, MD  fish oil-omega-3 fatty acids 1000 MG capsule Take 1 g by mouth daily.   Yes Historical Provider, MD  loratadine-pseudoephedrine (CLARITIN-D 12-HOUR) 5-120 MG per tablet Take 1 tablet by mouth 2 (two) times daily as needed. For seasonal allergies   Yes Historical Provider, MD  Multiple Vitamin (MULTIVITAMIN WITH MINERALS) TABS Take 1 tablet by mouth daily.   Yes Historical Provider, MD  naproxen (NAPROSYN) 250 MG tablet Take 500 mg by mouth daily as needed. For pain   Yes Historical Provider,  MD     Positive ROS: All other systems have been reviewed and were otherwise negative with the exception of those mentioned in the HPI and as above.  Physical Exam: General: Alert, no acute distress Cardiovascular: No pedal edema Respiratory: No cyanosis, no use of accessory musculature GI: No organomegaly, abdomen is soft and non-tender Skin: No lesions in the area of chief complaint Neurologic: Sensation intact distally Psychiatric: Patient is competent for consent with normal mood and affect Lymphatic: No axillary or cervical lymphadenopathy  MUSCULOSKELETAL: Right ankle is currently splinted, however there is still substantial prominence of the medial tibia, tenting the skin. Sensation is intact on the plantar and dorsal aspect of the foot. EHL and FHL seem to be firing, but she does this weakly secondary to pain.  Assessment: Right ankle Maisoneuve dislocation with residual substantial lateral translation of the talar side and medial skin tenting of the ankle.  Plan: Plan for open reduction internal fixation right ankle, syndesmotic fixation, possible medial exploration with possible deltoid repair if necessary. The fibular fracture is fairly high, and minimally displaced, and is not likely to need fixation.  The risks benefits and alternatives were discussed with the patient including but not limited to the risks of nonoperative treatment, versus surgical intervention including infection, bleeding, nerve injury, malunion, nonunion, the need for revision surgery, hardware prominence, hardware failure, the need for hardware removal, blood clots, cardiopulmonary complications, morbidity, mortality, among others, and they were willing to proceed.  We have also discussed the risks of posttraumatic arthrosis, among others.   Ulysees Robarts P, MD Cell [phone] Pager [phone]  02/21/2012 5:17 PM

## 2012-02-21 NOTE — ED Notes (Signed)
Assisted in concious sedation of patient for reduction of right ankle. Diprivan was administered per EDP orders.

## 2012-02-21 NOTE — Progress Notes (Signed)
Orthopedic Tech Progress Note Patient Details:  Alexandra Mcguire Dec 23, 1970 [id number]  Ortho Devices Type of Ortho Device: Short leg splint Ortho Device/Splint Location: right LE Ortho Device/Splint Interventions: Application   Sorren Vallier T 02/21/2012, 3:18 PM

## 2012-02-21 NOTE — Anesthesia Preprocedure Evaluation (Addendum)
Anesthesia Evaluation  Patient identified by MRN, date of birth, ID band Patient awake    Reviewed: Allergy & Precautions, H&P , NPO status , Patient's Chart, lab work & pertinent test results  History of Anesthesia Complications (+) PONV  Airway Mallampati: I TM Distance: >3 FB Neck ROM: Full    Dental   Pulmonary          Cardiovascular     Neuro/Psych    GI/Hepatic   Endo/Other    Renal/GU      Musculoskeletal   Abdominal   Peds  Hematology   Anesthesia Other Findings   Reproductive/Obstetrics                           Anesthesia Physical Anesthesia Plan  ASA: I  Anesthesia Plan: General   Post-op Pain Management: MAC Combined w/ Regional for Post-op pain   Induction: Intravenous, Rapid sequence and Cricoid pressure planned  Airway Management Planned: Oral ETT  Additional Equipment:   Intra-op Plan:   Post-operative Plan: Extubation in OR  Informed Consent: I have reviewed the patients History and Physical, chart, labs and discussed the procedure including the risks, benefits and alternatives for the proposed anesthesia with the patient or authorized representative who has indicated his/her understanding and acceptance.     Plan Discussed with: CRNA, Surgeon and Anesthesiologist  Anesthesia Plan Comments:        Anesthesia Quick Evaluation

## 2012-02-21 NOTE — Anesthesia Postprocedure Evaluation (Signed)
Anesthesia Post Note  Patient: Alexandra Mcguire  Procedure(s) Performed: Procedure(s) (LRB): OPEN REDUCTION INTERNAL FIXATION (ORIF) ANKLE FRACTURE (Right)  Anesthesia type: general  Patient location: PACU  Post pain: Pain level controlled  Post assessment: Patient's Cardiovascular Status Stable  Last Vitals:  Filed Vitals:   02/21/12 2111  BP:   Pulse: 81  Temp:   Resp: 18    Post vital signs: Reviewed and stable  Level of consciousness: sedated  Complications: No apparent anesthesia complications

## 2012-02-21 NOTE — Transfer of Care (Signed)
Immediate Anesthesia Transfer of Care Note  Patient: Alexandra Mcguire  Procedure(s) Performed: Procedure(s) (LRB): OPEN REDUCTION INTERNAL FIXATION (ORIF) ANKLE FRACTURE (Right)  Patient Location: PACU  Anesthesia Type: General  Level of Consciousness: awake, alert , oriented and patient cooperative  Airway & Oxygen Therapy: Patient Spontanous Breathing and Patient connected to nasal cannula oxygen  Post-op Assessment: Report given to PACU RN, Post -op Vital signs reviewed and stable and Patient moving all extremities  Post vital signs: Reviewed and stable  Complications: No apparent anesthesia complications

## 2012-02-21 NOTE — Anesthesia Procedure Notes (Signed)
Anesthesia Regional Block:  Popliteal block  Pre-Anesthetic Checklist: ,, timeout performed, Correct Patient, Correct Site, Correct Laterality, Correct Procedure, Correct Position, site marked, Risks and benefits discussed,  Surgical consent,  Pre-op evaluation,  At surgeon's request and post-op pain management  Laterality: Right  Prep: chloraprep       Needles:  Injection technique: Single-shot  Needle Type: Echogenic Stimulator Needle     Needle Length: 10cm 10 cm Needle Gauge: 21 G    Additional Needles:  Procedures: nerve stimulator Popliteal block  Nerve Stimulator or Paresthesia:  Response: 0.4 mA,   Additional Responses:   Narrative:  Start time: 02/21/2012 6:35 PM End time: 02/21/2012 6:50 PM Injection made incrementally with aspirations every 5 mL.  Performed by: Personally  Anesthesiologist: Arta Bruce MD  Additional Notes: 15cc Saphenous field block on medial side of upper tibia

## 2012-02-21 NOTE — ED Notes (Signed)
Pt up to bedside commode without any problems.  All jewry removed and given to husband 2 Mcgough colored rings, 5 earrings.

## 2012-02-21 NOTE — ED Notes (Signed)
130mg  propofol administered, 870mg  wasted with Beryl Meager, RN in sink.

## 2012-02-22 ENCOUNTER — Encounter (HOSPITAL_COMMUNITY): Payer: Self-pay | Admitting: Orthopedic Surgery

## 2012-02-22 DIAGNOSIS — S93439A Sprain of tibiofibular ligament of unspecified ankle, initial encounter: Secondary | ICD-10-CM | POA: Diagnosis present

## 2012-02-22 HISTORY — DX: Sprain of tibiofibular ligament of unspecified ankle, initial encounter: S93.439A

## 2012-02-22 NOTE — Discharge Summary (Addendum)
Physician Discharge Summary  Patient ID: Alexandra Mcguire MRN: [id number] DOB/AGE: [birth year] 41 y.o.  Admit date: 02/21/2012 Discharge date: 02/22/2012  Admission Diagnoses:  Ankle syndesmosis disruption  Discharge Diagnoses:  Principal Problem:  *Ankle syndesmosis disruption   Past Medical History  Diagnosis Date  . Ankle syndesmosis disruption 02/22/2012    Surgeries: Procedure(s): OPEN REDUCTION INTERNAL FIXATION (ORIF) ANKLE FRACTURE on 02/21/2012   Consultants (if any): Treatment Team:  Eulas Post, MD  Discharged Condition: Improved  Hospital Course: Alexandra Mcguire is an 41 y.o. female who was admitted 02/21/2012 with a diagnosis of Ankle syndesmosis disruption and went to the operating room on 02/21/2012 and underwent the above named procedures.    She was given perioperative antibiotics:  Anti-infectives     Start     Dose/Rate Route Frequency Ordered Stop   02/21/12 2145   ceFAZolin (ANCEF) IVPB 1 g/50 mL premix        1 g 100 mL/hr over 30 Minutes Intravenous Every 6 hours 02/21/12 2138 02/22/12 1118   02/21/12 1753   ceFAZolin (ANCEF) 2-3 GM-% IVPB SOLR     Comments: HARRISTON, MARICELA: cabinet override         02/21/12 1753 02/22/12 0559   02/21/12 1749   ceFAZolin (ANCEF) IVPB 2 g/50 mL premix        2 g 100 mL/hr over 30 Minutes Intravenous 60 min pre-op 02/21/12 1749 02/21/12 1825        .  She was given sequential compression devices, early ambulation for DVT prophylaxis.  She benefited maximally from the hospital stay and there were no complications.    Recent vital signs:  Filed Vitals:   02/22/12 0533  BP: 101/57  Pulse: 55  Temp: 98.6 F (37 C)  Resp: 16    Recent laboratory studies:  Lab Results  Component Value Date   HGB 15.4* 05/21/2011   HGB 13.2 03/27/2008   HGB 14.2 03/19/2008   Lab Results  Component Value Date   WBC 6.9 03/27/2008   PLT 201 03/27/2008   No results found for this basename: INR   Lab Results  Component  Value Date   BUN 14 03/19/2008   CREATININE 0.78 03/19/2008    Discharge Medications:   Medication List  As of 02/22/2012 11:53 AM   STOP taking these medications         naproxen 250 MG tablet         TAKE these medications         calcium-vitamin D 500-200 MG-UNIT per tablet   Commonly known as: OSCAL WITH D   Take 1 tablet by mouth daily.      fish oil-omega-3 fatty acids 1000 MG capsule   Take 1 g by mouth daily.      loratadine-pseudoephedrine 5-120 MG per tablet   Commonly known as: CLARITIN-D 12-hour   Take 1 tablet by mouth 2 (two) times daily as needed. For seasonal allergies      multivitamin with minerals Tabs   Take 1 tablet by mouth daily.      oxyCODONE-acetaminophen 10-325 MG per tablet   Commonly known as: PERCOCET   Take 1-2 tablets by mouth every 6 (six) hours as needed for pain. MAXIMUM TOTAL ACETAMINOPHEN DOSE IS 4000 MG PER DAY      promethazine 25 MG tablet   Commonly known as: PHENERGAN   Take 1 tablet (25 mg total) by mouth every 6 (six) hours as needed for nausea.  Diagnostic Studies: Dg Tibia/fibula Right  02/21/2012  *RADIOLOGY REPORT*  Clinical Data: Injured right leg.  RIGHT TIBIA AND FIBULA - 2 VIEW  Comparison:  None  Findings:  There is a relatively nondisplaced but comminuted fracture at the middle third distal third junction region of the fibular shaft.  No tibial fracture is identified.  IMPRESSION: Mid distal fibular shaft fracture.  Original Report Authenticated By: P. Loralie Champagne, M.D.   Dg Ankle Complete Right  02/21/2012  *RADIOLOGY REPORT*  Clinical Data: Ankle pain  RIGHT ANKLE - COMPLETE 3+ VIEW  Comparison: None.  Findings: Three views of the right ankle submitted.  There is mild displaced comminuted fracture distal shaft of the right fibula. There is disruption of the ankle mortise with medial displacement of the distal right tibia and increased space between the medial malleolus and medial aspect of the talus.   IMPRESSION:  There is mild displaced comminuted fracture distal shaft of the right fibula.  There is disruption of the ankle mortise with medial displacement of the distal right tibia and increased space between the medial malleolus and medial aspect of the talus.  Original Report Authenticated By: Natasha Mead, M.D.    Disposition: 01-Home or Self Care  Discharge Orders    Future Orders Please Complete By Expires   Diet general      Call MD / Call 911      Comments:   If you experience chest pain or shortness of breath, CALL 911 and be transported to the hospital emergency room.  If you develope a fever above 101 F, pus (white drainage) or increased drainage or redness at the wound, or calf pain, call your surgeon's office.   Discharge instructions      Comments:   Change dressing in 3 days and reapply fresh dressing, unless you have a splint (half cast).  If you have a splint/cast, just leave in place until your follow-up appointment.    Keep wounds dry for 3 weeks.  Leave steri-strips in place on skin.  Do not apply lotion or anything to the wound.   Constipation Prevention      Comments:   Drink plenty of fluids.  Prune juice may be helpful.  You may use a stool softener, such as Colace (over the counter) 100 mg twice a day.  Use MiraLax (over the counter) for constipation as needed.   Non weight bearing      Comments:   Right lower extremity      Follow-up Information    Follow up with Xzander Gilham P, MD in 2 weeks.   Contact information:   [facility] [address], [address] [place] [address] [phone]           Signed: Eulas Post 02/22/2012, 11:53 AM

## 2012-02-22 NOTE — Progress Notes (Signed)
Physical Therapy Evaluation Note  Past Medical History  Diagnosis Date  . Ankle syndesmosis disruption 02/22/2012    History reviewed. No pertinent past surgical history.   02/22/12 1046  PT Visit Information  Last PT Received On 02/22/12  Assistance Needed +1  PT Time Calculation  PT Start Time 1046  PT Stop Time 1111  PT Time Calculation (min) 25 min  Subjective Data  Subjective Pt received supine in bed with c/o of heachache over R LE. RN provided pain medication  Patient Stated Goal home  Restrictions  Weight Bearing Restrictions Yes  RLE Weight Bearing NWB  Home Living  Lives With Spouse (2 kids, one in w/c)  Available Help at Discharge Family;Available 24 hours/day  Type of Home House  Home Access Stairs to enter;Ramped entrance  Entrance Stairs-Number of Steps 2  Entrance Stairs-Rails None  Home Layout Able to live on main level with bedroom/bathroom;Two level  Alternate Level Stairs-Number of Steps 13  Alternate Level Stairs-Rails Right  Bathroom Shower/Tub Tub/shower unit;Walk-in Pension scheme manager Yes  How Accessible Accessible via walker  Home Adaptive Equipment Crutches;Shower chair with back  Prior Function  Level of Independence Independent  Able to Take Stairs? 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 for tasks performed  Right Upper Extremity Assessment  RUE ROM/Strength/Tone WFL  Left Upper Extremity Assessment  LUE ROM/Strength/Tone WFL  Right Lower Extremity Assessment  RLE ROM/Strength/Tone Deficits;Due to pain;Due to precautions (knee/hip WFL, no ankle ROM due to cast)  Left Lower Extremity Assessment  LLE ROM/Strength/Tone  WFL  Trunk Assessment  Trunk Assessment Normal  Bed Mobility  Bed Mobility Supine to Sit  Supine to Sit 5: Supervision;HOB flat  Transfers  Transfers Sit to Stand;Stand to Sit  Sit to Stand 5: Supervision;With upper extremity assist;From bed  Stand to Sit 5: Supervision;With upper extremity assist;To chair/3-in-1  Details for Transfer Assistance v/c's for crutch management and hand placement  Ambulation/Gait  Ambulation/Gait Assistance 4: Min guard  Ambulation Distance (Feet) 50 Feet  Assistive device Crutches  Ambulation/Gait Assistance Details limited by IV in L hand inhibiting pt to use L hand properly with crutches, pt demo'd safe technique with crutches and able to be compliant with R LE NWB.  Gait Pattern Step-to pattern  Stairs Yes  Stairs Assistance 4: Min guard  Stair Management Technique No rails;Forwards;With crutches  Number of Stairs 4   PT - End of Session  Equipment Utilized During Treatment Gait belt  Activity Tolerance Patient tolerated treatment well  Patient left in chair;with call bell/phone within reach;with family/visitor present  Nurse Communication Mobility status;Weight bearing status  PT Assessment  Clinical Impression Statement Pt s/p ORIF to R ankle presenting with R LE NWB. Patient safe to d/c home today with crutches and support of family. patient and spouse demo'd safe technique with ambulation and stair negotiation with use of crutches. PT signing off on patient. Please re-consult if skilled PT services needed in future.  PT Recommendation/Assessment Patent does not need any further PT services  Barriers to Discharge None  No Skilled PT All education completed;Patient will have necessary level of assist by caregiver at discharge;Patient is supervision for all activity/mobility  PT Recommendation  Follow Up Recommendations No PT follow up  Equipment Recommended (pt provided with crutches in ED, PT appropriately adjusted crutches for optimal fit for  patient  Individuals Consulted  Consulted and Agree with Results and Recommendations Patient  PT G-Codes **NOT FOR INPATIENT CLASS**  Functional Limitation Mobility: Walking and moving around  Mobility: Walking and Moving Around Current Status (Z6109) CI  Mobility: Walking and Moving Around Goal Status (U0454) CH  Mobility: Walking and Moving Around Discharge Status (U9811) CI  PT General Charges  $$ ACUTE PT VISIT 1 Procedure  PT Evaluation  $Initial PT Evaluation Tier II 1 Procedure  Written Expression  Dominant Hand Right     Lewis Shock, PT, DPT Pager #: [phone] Office #: [phone]

## 2013-01-30 ENCOUNTER — Ambulatory Visit: Payer: 59 | Attending: Orthopedic Surgery

## 2013-01-30 DIAGNOSIS — M6281 Muscle weakness (generalized): Secondary | ICD-10-CM | POA: Insufficient documentation

## 2013-01-30 DIAGNOSIS — IMO0001 Reserved for inherently not codable concepts without codable children: Secondary | ICD-10-CM | POA: Insufficient documentation

## 2013-01-30 DIAGNOSIS — M25579 Pain in unspecified ankle and joints of unspecified foot: Secondary | ICD-10-CM | POA: Insufficient documentation

## 2013-01-30 DIAGNOSIS — R5381 Other malaise: Secondary | ICD-10-CM | POA: Insufficient documentation

## 2013-02-06 ENCOUNTER — Ambulatory Visit: Payer: 59 | Admitting: Physical Therapy

## 2013-02-08 ENCOUNTER — Ambulatory Visit: Payer: 59 | Admitting: Physical Therapy

## 2013-02-15 ENCOUNTER — Ambulatory Visit: Payer: 59 | Admitting: Physical Therapy

## 2013-02-19 ENCOUNTER — Ambulatory Visit: Payer: 59 | Admitting: Physical Therapy

## 2013-02-22 ENCOUNTER — Ambulatory Visit: Payer: 59 | Attending: Physical Therapy | Admitting: Physical Therapy

## 2013-02-26 ENCOUNTER — Ambulatory Visit: Payer: 59 | Admitting: Physical Therapy

## 2013-03-01 ENCOUNTER — Encounter: Payer: 59 | Admitting: Physical Therapy

## 2013-03-05 ENCOUNTER — Ambulatory Visit: Payer: 59

## 2015-10-02 MED FILL — NECON 1-50-28 TABLET: 1-50 | 84 days supply | Qty: 84 | Fill #3

## 2015-12-30 MED FILL — NECON 1-50-28 TABLET: 1-50 | 28 days supply | Qty: 28 | Fill #4

## 2016-01-30 MED FILL — NECON 1-50-28 TABLET: 1-50 | 28 days supply | Qty: 28 | Fill #0

## 2016-02-27 MED FILL — NECON 1-50-28 TABLET: 1-50 | 28 days supply | Qty: 28 | Fill #1

## 2016-03-15 ENCOUNTER — Other Ambulatory Visit: Payer: Self-pay

## 2016-03-15 ENCOUNTER — Ambulatory Visit (INDEPENDENT_AMBULATORY_CARE_PROVIDER_SITE_OTHER): Payer: 59 | Admitting: Family Medicine

## 2016-03-15 VITALS — BP 118/78 | HR 76 | Wt 154.0 lb

## 2016-03-15 DIAGNOSIS — M25512 Pain in left shoulder: Secondary | ICD-10-CM | POA: Diagnosis not present

## 2016-03-15 DIAGNOSIS — M75112 Incomplete rotator cuff tear or rupture of left shoulder, not specified as traumatic: Secondary | ICD-10-CM | POA: Insufficient documentation

## 2016-03-15 HISTORY — DX: Incomplete rotator cuff tear or rupture of left shoulder, not specified as traumatic: M75.112

## 2016-03-15 MED ORDER — NITROGLYCERIN 0.2 MG/HR TD PT24: MEDICATED_PATCH | TRANSDERMAL | 1 refills | Status: DC

## 2016-03-15 MED ORDER — MELOXICAM 15 MG PO TABS: 15.0000 mg | ORAL_TABLET | Freq: Every day | ORAL | 0 refills | Status: DC

## 2016-03-15 MED FILL — MELOXICAM 15 MG TABLET: 15 | 30 days supply | Qty: 30 | Fill #0

## 2016-03-15 MED FILL — NITROGLYCERIN 0.2 MG/HR PTC: 0.2 | 88 days supply | Qty: 22 | Fill #0

## 2016-03-15 NOTE — Progress Notes (Signed)
[facility] [place], [place] [address] Phone: [phone] Subjective:    I'm seeing this patient by the request  of:  LOWE,DAVID C, MD  CC: shoulder pain  [id number]  Alexandra Mcguire is a 45 y.o. female coming in with complaint of shoulder pain.   previous imaging: August 2012 showed that patient didn't have severe supraspinatus and infraspinatus tendinopathy mild acromial clavicular arthritis with a subacromial bursitis. This was independently visualized by me. This was the right shoulder. Patient is now having more of a left shoulder pain. Patient describes pain as a dull, throbbing aching sensation. Patient does have a handicapped child and was trying to move her child and started having increasing discomfort. Patient states that in the most likely represents sensation. Patient had difficulty moving it initially. An states that this is approximately 1 month ago. Denies any numbness. States that the pain is even severe at night and wakes her up. 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   Past Medical History:  Diagnosis Date  . Ankle syndesmosis disruption 02/22/2012   Past Surgical History:  Procedure Laterality Date  . ORIF ANKLE FRACTURE  02/21/2012   Procedure: OPEN REDUCTION INTERNAL FIXATION (ORIF) ANKLE FRACTURE;  Surgeon: Johnny Bridge, MD;  Location: [facility];  Service: Orthopedics;  Laterality: Right;   Social History   Social History  . Marital status: Married    Spouse name: N/A  . Number of children: N/A  . Years of education: N/A   Social History Main Topics  . Smoking status: Not on file  . Smokeless tobacco: Not on file  . Alcohol use Not on file  . Drug use: Unknown  . Sexual activity: Not on file   Other Topics Concern  . Not on file   Social History Narrative  . No narrative on file   Allergies  Allergen Reactions  . Sulfa Antibiotics  Rash   No family history on file. no family history of rheumatological diseases Past medical history, social, surgical and family history all reviewed in electronic medical record.  No pertanent information unless stated regarding to the chief complaint.   Review of Systems: No headache, visual changes, nausea, vomiting, diarrhea, constipation, dizziness, abdominal pain, skin rash, fevers, chills, night sweats, weight loss, swollen lymph nodes, body aches, joint swelling, muscle aches, chest pain, shortness of breath, mood changes.   Objective  Blood pressure 118/78, pulse 76, weight 154 lb (69.9 kg).  General: No apparent distress alert and oriented x3 mood and affect normal, dressed appropriately.  HEENT: Pupils equal, extraocular movements intact  Respiratory: Patient's speak in full sentences and does not appear short of breath  Cardiovascular: No lower extremity edema, non tender, no erythema  Skin: Warm dry intact with no signs of infection or rash on extremities or on axial skeleton.  Abdomen: Soft nontender  Neuro: Cranial nerves II through XII are intact, neurovascularly intact in all extremities with 2+ DTRs and 2+ pulses.  Lymph: No lymphadenopathy of posterior or anterior cervical chain or axillae bilaterally.  Gait normal with good balance and coordination.  MSK:  Non tender with full range of motion and good stability and symmetric strength and tone of  elbows, wrist, hip, knee and ankles bilaterally.  Shoulder: left Inspection reveals no abnormalities, atrophy or asymmetry. Palpation is normal with no tenderness over AC joint or bicipital groove. ROM is full in  all planes passively. Rotator cuff strength 4 out of 5 compared to contralateral side signs of impingement with positive Neer and Hawkin's tests, but negative empty can sign. Speeds and Yergason's tests positive No labral pathology noted with negative Obrien's, negative clunk and good stability. Normal scapular  function observed. No painful arc and no drop arm sign. No apprehension sign Hunter lateral shoulder unremarkable  MSK US performed of: Right This study was ordered, performed, and interpreted by Charlann Boxer D.O.  Shoulder:   Supraspinatus:  Patient's tendon appears normal. Patient though at the insertion on the humeral head and has some bony irregularity that could go with a healing avulsion Infraspinatus:  Appears normal on long and transverse views. Significant increase in Doppler flow Subscapularis:  Patient does have healing of what appears to be a tear of approximately 50% of the tendon on the articular side. Mild retraction with scar tissue formation noted Teres Minor:  Appears normal on long and transverse views. AC joint:  Mild arthritic changes Glenohumeral Joint:  Appears normal without effusion. Glenoid Labrum: mild degenerative changes Biceps Tendon:  Positive hypoechoic changes within the tendon sheath but no true tearing  Impression: partial rotator cuff tear with healing interval questionable avulsion with no displacement at the supraspinatus   Procedure note [id number]; 15 minutes spent for Therapeutic exercises as stated in above notes.  This included exercises focusing on stretching, strengthening, with significant focus on eccentric aspects. 97110; 15 minutes spent for Therapeutic exercises as stated in above notes.  This included exercises focusing on stretching, strengthening, with significant focus on eccentric aspects.   Proper technique shown and discussed handout in great detail with ATC.  All questions were discussed and answered.    Proper technique shown and discussed handout in great detail with ATC.  All questions were discussed and answered.     Impression and Recommendations:     This case required medical decision making of moderate complexity.      Note: This dictation was prepared with Dragon dictation along with smaller phrase technology. Any  transcriptional errors that result from this process are unintentional.

## 2016-03-15 NOTE — Patient Instructions (Addendum)
Good to see you.  Ice 20 minutes 2 times daily. Usually after activity and before bed. Exercises 3 times a week.  Meloxicam daily for 10 days then as needed Vitmain D 2000 IU dialy  Turmeric 500mg  twice daily  Nitroglycerin Protocol   Apply 1/4 nitroglycerin patch to affected area daily.  Change position of patch within the affected area every 24 hours.  You may experience a headache during the first 1-2 weeks of using the patch, these should subside.  If you experience headaches after beginning nitroglycerin patch treatment, you may take your preferred over the counter pain reliever.  Another side effect of the nitroglycerin patch is skin irritation or rash related to patch adhesive.  Please notify our office if you develop more severe headaches or rash, and stop the patch.  Tendon healing with nitroglycerin patch may require 12 to 24 weeks depending on the extent of injury.  Men should not use if taking Viagra, Cialis, or Levitra.   Do not use if you have migraines or rosacea.  See me again in 3 weeks to make sure you are doing well.

## 2016-03-15 NOTE — Assessment & Plan Note (Signed)
Patient does have a small tear but seems to be healing. We will try the nitroglycerin patches, discussed oral anti-inflammatories. Discussed icing regimen. Discussed over-the-counter medications. We also discussed objective it down which was potentially avoid. Patient work with Product/process development scientist to learn home exercises in greater detail. Follow-up again in 3-4 weeks. If worsening symptoms consider injection as well as referral to formal physical therapy.

## 2016-03-22 NOTE — Progress Notes (Signed)
[facility] [place], [place] [address] Phone: [phone] Subjective:    I'm seeing this patient by the request  of:  LOWE,DAVID C, MD  CC: shoulder pain f/u  [id number]  Alexandra Mcguire is a 45 y.o. female coming in with complaint of shoulder pain. Patient was found to have a partial rotator cuff tear. We tried the nitroglycerin patches. Patient was to do home exercises and icing. Was just seen one week ago. Was to follow-up after 3 weeks. Patient states unfortunately the pain seems to be worse. Patient states that the nitroglycerin does not seem to help this week. Patient denies any new symptoms just worsening symptoms overall. Asian feels like she has had lack of movement with external rotation.    previous imaging: August 2012 showed that patient didn't have severe supraspinatus and infraspinatus tendinopathy mild acromial clavicular arthritis with a subacromial bursitis. This was independently visualized by me.    Past Medical History:  Diagnosis Date  . Ankle syndesmosis disruption 02/22/2012   Past Surgical History:  Procedure Laterality Date  . ORIF ANKLE FRACTURE  02/21/2012   Procedure: OPEN REDUCTION INTERNAL FIXATION (ORIF) ANKLE FRACTURE;  Surgeon: Johnny Bridge, MD;  Location: [facility];  Service: Orthopedics;  Laterality: Right;   Social History   Social History  . Marital status: Married    Spouse name: N/A  . Number of children: N/A  . Years of education: N/A   Social History Main Topics  . Smoking status: Never Smoker  . Smokeless tobacco: None  . Alcohol use None  . Drug use: Unknown  . Sexual activity: Not Asked   Other Topics Concern  . None   Social History Narrative  . None   Allergies  Allergen Reactions  . Sulfa Antibiotics Rash   No family history on file. no family history of rheumatological diseases Past medical history, social, surgical and family history all reviewed in electronic medical  record.  No pertanent information unless stated regarding to the chief complaint.   Review of Systems: No headache, visual changes, nausea, vomiting, diarrhea, constipation, dizziness, abdominal pain, skin rash, fevers, chills, night sweats, weight loss, swollen lymph nodes, body aches, joint swelling, muscle aches, chest pain, shortness of breath, mood changes.   Objective  Blood pressure 126/82, pulse 80, height 5' (1.524 m), weight 154 lb (69.9 kg), last menstrual period 03/23/2016, SpO2 98 %.  General: No apparent distress alert and oriented x3 mood and affect normal, dressed appropriately.  HEENT: Pupils equal, extraocular movements intact  Respiratory: Patient's speak in full sentences and does not appear short of breath  Cardiovascular: No lower extremity edema, non tender, no erythema  Skin: Warm dry intact with no signs of infection or rash on extremities or on axial skeleton.  Abdomen: Soft nontender  Neuro: Cranial nerves II through XII are intact, neurovascularly intact in all extremities with 2+ DTRs and 2+ pulses.  Lymph: No lymphadenopathy of posterior or anterior cervical chain or axillae bilaterally.  Gait normal with good balance and coordination.  MSK:  Non tender with full range of motion and good stability and symmetric strength and tone of  elbows, wrist, hip, knee and ankles bilaterally.  Shoulder: left Inspection reveals no abnormalities, atrophy or asymmetry. Palpation is normal with no tenderness over AC joint or bicipital groove. New finding is is lacking the last 5 of external rotation. Rotator cuff strength 4 out of 5 compared to contralateral sideSeems about the same as  previous exam signs of impingement with positive Neer and Hawkin's tests, but negative empty can sign. Speeds and Yergason's tests positive No labral pathology noted with negative Obrien's, negative clunk and good stability. Normal scapular function observed. No painful arc and no drop arm  sign. No apprehension sign Hunter lateral shoulder unremarkable  Procedure: Real-time Ultrasound Guided Injection of left glenohumeral joint Device: GE Logiq E  Ultrasound guided injection is preferred based studies that show increased duration, increased effect, greater accuracy, decreased procedural pain, increased response rate with ultrasound guided versus blind injection.  Verbal informed consent obtained.  Time-out conducted.  Noted no overlying erythema, induration, or other signs of local infection.  Skin prepped in a sterile fashion.  Local anesthesia: Topical Ethyl chloride.  With sterile technique and under real time ultrasound guidance:  Joint visualized.  23g 1  inch needle inserted posterior approach. Pictures taken for needle placement. Patient did have injection of 2 cc of 1% lidocaine, 2 cc of 0.5% Marcaine, and 1cc of Kenalog 40 mg/dL. Completed without difficulty  Pain immediately resolved suggesting accurate placement of the medication.  Advised to call if fevers/chills, erythema, induration, drainage, or persistent bleeding.  Images permanently stored and available for review in the ultrasound unit.  Impression: Technically successful ultrasound guided injection.        Impression and Recommendations:     This case required medical decision making of moderate complexity.      Note: This dictation was prepared with Dragon dictation along with smaller phrase technology. Any transcriptional errors that result from this process are unintentional.

## 2016-03-23 ENCOUNTER — Encounter: Payer: Self-pay | Admitting: Family Medicine

## 2016-03-23 ENCOUNTER — Other Ambulatory Visit: Payer: Self-pay

## 2016-03-23 ENCOUNTER — Ambulatory Visit (INDEPENDENT_AMBULATORY_CARE_PROVIDER_SITE_OTHER): Payer: 59 | Admitting: Family Medicine

## 2016-03-23 ENCOUNTER — Ambulatory Visit (INDEPENDENT_AMBULATORY_CARE_PROVIDER_SITE_OTHER)
Admission: RE | Admit: 2016-03-23 | Discharge: 2016-03-23 | Disposition: A | Discharge status: Home / Self Care | Payer: 59 | Source: Ambulatory Visit | Attending: Family Medicine | Admitting: Family Medicine

## 2016-03-23 DIAGNOSIS — M75112 Incomplete rotator cuff tear or rupture of left shoulder, not specified as traumatic: Secondary | ICD-10-CM

## 2016-03-23 DIAGNOSIS — M75102 Unspecified rotator cuff tear or rupture of left shoulder, not specified as traumatic: Secondary | ICD-10-CM | POA: Diagnosis not present

## 2016-03-23 NOTE — Assessment & Plan Note (Addendum)
Patient was having worsening symptoms. We discussed different treatment options including advance imaging. Patient has early film formal physical therapy. Discussed with patient at great length. We discussed icing regimen, home exercises, which activities to do in which ones to potentially avoid. Patient has elected to try the injection today and did have good resolution of pain immediately. Discussed with patient that there is a possibility for labral tear and if no significant improvement by neck week I do feel that advance imaging is warranted. Patient is very active and lose more time she Main her more difficulty if any surgical intervention is necessary.

## 2016-03-23 NOTE — Patient Instructions (Addendum)
Good to see you  Would continue the nitroglycerin.  Injected the shoulder today  Get xray downstairs today  No exercises for 2 days.  Write me Monday and if not better then consider MR- Arthrogram.

## 2016-03-26 ENCOUNTER — Encounter: Payer: Self-pay | Admitting: Family Medicine

## 2016-03-26 ENCOUNTER — Ambulatory Visit (INDEPENDENT_AMBULATORY_CARE_PROVIDER_SITE_OTHER): Payer: 59 | Admitting: Family Medicine

## 2016-03-26 VITALS — BP 144/100 | HR 63 | Temp 98.2°F | Resp 20 | Ht 65.0 in | Wt 153.8 lb

## 2016-03-26 DIAGNOSIS — Z13220 Encounter for screening for lipoid disorders: Secondary | ICD-10-CM

## 2016-03-26 DIAGNOSIS — E559 Vitamin D deficiency, unspecified: Secondary | ICD-10-CM | POA: Diagnosis not present

## 2016-03-26 DIAGNOSIS — Z8249 Family history of ischemic heart disease and other diseases of the circulatory system: Secondary | ICD-10-CM | POA: Insufficient documentation

## 2016-03-26 DIAGNOSIS — Z789 Other specified health status: Secondary | ICD-10-CM | POA: Insufficient documentation

## 2016-03-26 DIAGNOSIS — Z7189 Other specified counseling: Secondary | ICD-10-CM

## 2016-03-26 DIAGNOSIS — Z130 Encounter for screening for diseases of the blood and blood-forming organs and certain disorders involving the immune mechanism: Secondary | ICD-10-CM

## 2016-03-26 DIAGNOSIS — Z131 Encounter for screening for diabetes mellitus: Secondary | ICD-10-CM

## 2016-03-26 DIAGNOSIS — Z6825 Body mass index (BMI) 25.0-25.9, adult: Secondary | ICD-10-CM

## 2016-03-26 DIAGNOSIS — Z136 Encounter for screening for cardiovascular disorders: Secondary | ICD-10-CM

## 2016-03-26 DIAGNOSIS — Z7689 Persons encountering health services in other specified circumstances: Secondary | ICD-10-CM

## 2016-03-26 DIAGNOSIS — E663 Overweight: Secondary | ICD-10-CM | POA: Insufficient documentation

## 2016-03-26 DIAGNOSIS — Z1329 Encounter for screening for other suspected endocrine disorder: Secondary | ICD-10-CM

## 2016-03-26 DIAGNOSIS — Z309 Encounter for contraceptive management, unspecified: Secondary | ICD-10-CM

## 2016-03-26 NOTE — Progress Notes (Signed)
Patient ID: Alexandra Mcguire, female  DOB: [birth year], 45 y.o.   MRN: [id number] Patient Care Team    Relationship Specialty Notifications Start End  Ma Hillock, DO PCP - General Family Medicine  03/26/16   Louretta Shorten, MD Consulting Physician Obstetrics and Gynecology  03/26/16     Subjective:  Alexandra Mcguire is a 45 y.o.  female present for new patient establishment. All past medical history, surgical history, allergies, family history, immunizations, medications and social history were obtained and updated in the electronic medical record today. All recent labs per care-everywhere reviewed. Records from prior PCP have been reviewed/requested.  Health maintenance:  Colonoscopy: no fhx, screen 50 Mammogram: completed:a couple of years ago. No fhx, performs SBE.  Cervical cancer screening: Dr. Corinna Capra (GYn) PAP last year 12/2014 and normal.  Immunizations: tdap unknown, Influenza yearly (encouraged yearly) Infectious disease screening: Completed through gynecology  DEXA: Never  Immunization History  Administered Date(s) Administered  . Influenza-Unspecified 05/24/2015     Past Medical History:  Diagnosis Date  . Allergy   . Ankle syndesmosis disruption 02/22/2012  . Endometriosis    Allergies  Allergen Reactions  . Sulfa Antibiotics Rash   Past Surgical History:  Procedure Laterality Date  . BREAST LUMPECTOMY Left 1989, 1991   fibroadenoma  . CESAREAN SECTION  2001  . EXPLORATORY LAPAROTOMY  1994   Endometriosis  . OOPHORECTOMY Left 1996  . ORIF ANKLE FRACTURE  02/21/2012   Procedure: OPEN REDUCTION INTERNAL FIXATION (ORIF) ANKLE FRACTURE;  Surgeon: Johnny Bridge, MD;  Location: [facility];  Service: Orthopedics;  Laterality: Right;  . SHOULDER ARTHROSCOPY Right 2012   Family History  Problem Relation Age of Onset  . Heart disease Mother   . Arthritis Mother   . Mental illness Father   . Heart disease Father   . Osteosarcoma Father     12  . Arthritis Maternal  Grandmother   . Heart disease Maternal Grandmother   . Heart disease Maternal Grandfather   . Early death Maternal Grandfather   . Heart disease Paternal Grandmother   . Heart disease Paternal Grandfather   . Early death Paternal Grandfather    Social History   Social History  . Marital status: Married    Spouse name: N/A  . Number of children: N/A  . 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 Smoking status: Never Smoker  . Smokeless tobacco: Never Used  . Alcohol use Yes     Comment: occasional  . Drug use: No  . Sexual activity: Yes    Partners: Male    Birth control/ protection: Pill     Comment: Married   Other Topics Concern  . Not on file   Social History Narrative   Stanton Kidney to [place]. 2 children named Wells Guiles and [place].   Masters degree, employed as a Glass blower/designer.   Drinks caffeine, takes herbal remedies, takes a daily vitamin.   Wears her seatbelt, wears a bicycle helmet, smoke detector in the home.   Exercises routinely.   Feels safe in her  relationships.     Medication List       Accurate as of 03/26/16  6:48 PM. Always use your most recent med list.          fish oil-omega-3 fatty acids 1000 MG capsule Take 1 g by mouth daily.  Green Tea 200 MG Caps Take by mouth.   loratadine-pseudoephedrine 5-120 MG tablet Commonly known as:  CLARITIN-D 12-hour Take 1 tablet by mouth 2 (two) times daily as needed. For seasonal allergies   MAGNESIUM GLYCINATE PLUS PO Take 1 capsule by mouth daily.   meloxicam 15 MG tablet Commonly known as:  MOBIC Take 1 tablet (15 mg total) by mouth daily.   multivitamin with minerals Tabs tablet Take 1 tablet by mouth daily.   NECON 1/50 (28) 1-50 MG-MCG tablet Generic drug:  Norethindrone-Mestranol   nitroGLYCERIN 0.2 mg/hr patch Commonly known as:  NITRODUR - Dosed in mg/24 hr 1/4 patch daily   Vitamin  D3 5000 units Caps Take 1 capsule by mouth daily.        No results found for this or any previous visit (from the past 2160 hour(s)).  Dg Shoulder Left  Result Date: 03/23/2016 CLINICAL DATA:  Clinical suspicion of partial tear of the rotator cuff, evaluation for avulsion fracture. EXAM: LEFT SHOULDER - 2+ VIEW COMPARISON:  None in PACs FINDINGS: The bones are subjectively adequately mineralized. The glenohumeral and AC joints are reasonably well-maintained. The subacromial subdeltoid space is normal. No acute fracture or dislocation is observed. IMPRESSION: No acute avulsion fracture nor other acute bony abnormality is demonstrated. There is no significant osteoarthritic change. Electronically Signed   By: David  Martinique M.D.   On: 03/23/2016 13:31     ROS: 14 pt review of systems performed and negative (unless mentioned in an HPI)  Objective: BP (!) 144/100 (BP Location: Right Arm, Cuff Size: Small)   Pulse 63   Temp 98.2 F (36.8 C) (Oral)   Resp 20   Ht 5\' 5"  (1.651 m)   Wt 153 lb 12 oz (69.7 kg)   LMP 03/19/2016   SpO2 96%   BMI 25.59 kg/m  Gen: Afebrile. No acute distress. Nontoxic in appearance, well-developed, well-nourished,  pleasant Caucasian female HENT: AT. [place]. MMM no Cough on exam, no hoarseness on exam. Eyes:Pupils Equal Round Reactive to light, Extraocular movements intact,  Conjunctiva without redness, discharge or icterus. Neck/lymp/endocrine: Supple, no lymphadenopathy CV: RRR, no edema Chest: CTAB, no wheeze, rhonchi or crackles.  Abd: Soft. NTND. BS present. Skin: Warm and well-perfused. Skin intact. Neuro/Msk: Normal gait. PERLA. EOMi. Alert. Oriented x3.  Psych: Normal affect, dress and demeanor. Normal speech. Normal thought content and judgment.  Assessment/plan: Alexandra Mcguire is a 45 y.o. female present for Encounter to establish care with new doctor.  Encounter to establish care with new doctor Family history of heart disease Uses birth  control BMI 25.0-25.9, adult - All medical history reviewed/obtained from patient today. - Patient on high-dose birth control pill, states that this is used secondary to breakthrough bleeding she reports being worked up through her gynecologist, she is a nonsmoker. - Patient has significant family history of cardiovascular disease with early death. - Patient has a BMI just barely over 25 she is very athletic and muscular. She is a Physiological scientist and  exercises daily. - Patient encouraged to schedule a physical within the next 1-2 months, allowing time for the rest of her records to be received and reviewed. Future labs have been placed for her to make a lab appointment 2-3 days prior to her physical appointment.  Physical in 1-2 months with fasting labs collected 2 days prior.  Greater than 30 minutes spent with patient, >50% of time spent face to face counseling patient and coordinating care.  Electronically signed by: Howard Pouch,  DO [place]

## 2016-03-26 NOTE — Patient Instructions (Addendum)
It was a pleasure meeting you today! Please make appt for physical with fasting labs 1-2 months.

## 2016-03-31 MED FILL — NECON 1-50-28 TABLET: 1-50 | 28 days supply | Qty: 28 | Fill #0

## 2016-04-05 ENCOUNTER — Ambulatory Visit: Payer: 59 | Admitting: Family Medicine

## 2016-04-30 ENCOUNTER — Other Ambulatory Visit: Payer: 59

## 2016-05-04 ENCOUNTER — Encounter: Payer: 59 | Admitting: Family Medicine

## 2016-05-10 ENCOUNTER — Encounter: Payer: Self-pay | Admitting: Family Medicine

## 2016-05-10 ENCOUNTER — Ambulatory Visit: Payer: 59 | Admitting: Family Medicine

## 2016-05-24 ENCOUNTER — Encounter: Payer: Self-pay | Admitting: Family Medicine

## 2016-05-24 DIAGNOSIS — E559 Vitamin D deficiency, unspecified: Secondary | ICD-10-CM | POA: Insufficient documentation

## 2016-06-03 ENCOUNTER — Other Ambulatory Visit: Payer: 59

## 2016-06-09 ENCOUNTER — Encounter: Payer: 59 | Admitting: Family Medicine

## 2016-07-13 ENCOUNTER — Ambulatory Visit (INDEPENDENT_AMBULATORY_CARE_PROVIDER_SITE_OTHER): Payer: 59 | Admitting: Family Medicine

## 2016-07-13 ENCOUNTER — Encounter: Payer: Self-pay | Admitting: Family Medicine

## 2016-07-13 ENCOUNTER — Telehealth: Payer: Self-pay | Admitting: Family Medicine

## 2016-07-13 VITALS — BP 133/83 | HR 73 | Temp 97.9°F | Resp 20 | Ht 65.0 in | Wt 153.2 lb

## 2016-07-13 DIAGNOSIS — Z13220 Encounter for screening for lipoid disorders: Secondary | ICD-10-CM

## 2016-07-13 DIAGNOSIS — Z1231 Encounter for screening mammogram for malignant neoplasm of breast: Secondary | ICD-10-CM

## 2016-07-13 DIAGNOSIS — Z0000 Encounter for general adult medical examination without abnormal findings: Secondary | ICD-10-CM | POA: Diagnosis not present

## 2016-07-13 DIAGNOSIS — Z7689 Persons encountering health services in other specified circumstances: Secondary | ICD-10-CM | POA: Diagnosis not present

## 2016-07-13 DIAGNOSIS — Z1239 Encounter for other screening for malignant neoplasm of breast: Secondary | ICD-10-CM

## 2016-07-13 DIAGNOSIS — Z130 Encounter for screening for diseases of the blood and blood-forming organs and certain disorders involving the immune mechanism: Secondary | ICD-10-CM | POA: Diagnosis not present

## 2016-07-13 DIAGNOSIS — Z1329 Encounter for screening for other suspected endocrine disorder: Secondary | ICD-10-CM | POA: Diagnosis not present

## 2016-07-13 DIAGNOSIS — E559 Vitamin D deficiency, unspecified: Secondary | ICD-10-CM

## 2016-07-13 DIAGNOSIS — Z6825 Body mass index (BMI) 25.0-25.9, adult: Secondary | ICD-10-CM | POA: Diagnosis not present

## 2016-07-13 DIAGNOSIS — Z136 Encounter for screening for cardiovascular disorders: Secondary | ICD-10-CM

## 2016-07-13 DIAGNOSIS — Z8249 Family history of ischemic heart disease and other diseases of the circulatory system: Secondary | ICD-10-CM | POA: Diagnosis not present

## 2016-07-13 DIAGNOSIS — Z131 Encounter for screening for diabetes mellitus: Secondary | ICD-10-CM

## 2016-07-13 LAB — CBC
HCT: 43.5 % (ref 36.0–46.0)
Hemoglobin: 14.7 g/dL (ref 12.0–15.0)
MCHC: 33.7 g/dL (ref 30.0–36.0)
MCV: 97.6 fl (ref 78.0–100.0)
PLATELETS: 221 10*3/uL (ref 150.0–400.0)
RBC: 4.46 Mil/uL (ref 3.87–5.11)
RDW: 14.1 % (ref 11.5–15.5)
WBC: 4.9 10*3/uL (ref 4.0–10.5)

## 2016-07-13 LAB — LIPID PANEL
CHOL/HDL RATIO: 3
Cholesterol: 229 mg/dL — ABNORMAL HIGH (ref 0–200)
HDL: 77.2 mg/dL (ref >39.00)
LDL Cholesterol: 142 mg/dL — ABNORMAL HIGH (ref 0–99)
NONHDL: 151.92
Triglycerides: 49 mg/dL (ref 0.0–149.0)
VLDL: 9.8 mg/dL (ref 0.0–40.0)

## 2016-07-13 LAB — COMPREHENSIVE METABOLIC PANEL
ALBUMIN: 4.5 g/dL (ref 3.5–5.2)
ALK PHOS: 32 U/L — AB (ref 39–117)
ALT: 13 U/L (ref 0–35)
AST: 14 U/L (ref 0–37)
BILIRUBIN TOTAL: 0.9 mg/dL (ref 0.2–1.2)
BUN: 14 mg/dL (ref 6–23)
CALCIUM: 9.4 mg/dL (ref 8.4–10.5)
CHLORIDE: 104 meq/L (ref 96–112)
CO2: 25 mEq/L (ref 19–32)
CREATININE: 0.84 mg/dL (ref 0.40–1.20)
GFR: 77.7 mL/min (ref >60.00)
Glucose, Bld: 86 mg/dL (ref 70–99)
Potassium: 4.4 mEq/L (ref 3.5–5.1)
SODIUM: 138 meq/L (ref 135–145)
TOTAL PROTEIN: 7.2 g/dL (ref 6.0–8.3)

## 2016-07-13 LAB — TSH: TSH: 1.32 u[IU]/mL (ref 0.35–4.50)

## 2016-07-13 LAB — HEMOGLOBIN A1C: HEMOGLOBIN A1C: 5 % (ref 4.6–6.5)

## 2016-07-13 LAB — VITAMIN D 25 HYDROXY (VIT D DEFICIENCY, FRACTURES): VITD: 47.24 ng/mL (ref 30.00–100.00)

## 2016-07-13 NOTE — Telephone Encounter (Signed)
Please call pt: - her labs are normal.  - Vit d level is good. Continue fish oil supplement.

## 2016-07-13 NOTE — Patient Instructions (Signed)

## 2016-07-13 NOTE — Progress Notes (Signed)
Patient ID: Alexandra Mcguire, female  DOB: 1970/10/14, 45 y.o.   MRN: [id number] Patient Care Team    Relationship Specialty Notifications Start End  Ma Hillock, DO PCP - General Family Medicine  03/26/16   Louretta Shorten, MD Consulting Physician Obstetrics and Gynecology  03/26/16     Subjective:  Alexandra Mcguire is a 45 y.o.  female present for new patient establishment. All past medical history, surgical history, allergies, family history, immunizations, medications and social history were obtained and updated in the electronic medical record today. All recent labs per care-everywhere reviewed. Records from prior PCP have been reviewed/requested.  Health maintenance:  Colonoscopy: no fhx, screen 50 Mammogram: No Fhx. completed:a couple of years ago. No fhx, performs SBE.  Cervical cancer screening: Dr. Corinna Capra (GYN) PAP last year 12/2014 and normal. [facility]. Immunizations: tdap 08/2007, Influenza UTD 2017(encouraged yearly) Infectious disease screening: HIV completed 2016 DEXA: Never  Immunization History  Administered Date(s) Administered  . Influenza-Unspecified 05/24/2015, 05/23/2016     Past Medical History:  Diagnosis Date  . Allergy   . Ankle syndesmosis disruption 02/22/2012  . Endometriosis   . Fibroadenoma of breast, left   . Miscarriage 2009   2009  . Ovarian cyst    Allergies  Allergen Reactions  . Sulfa Antibiotics Rash   Past Surgical History:  Procedure Laterality Date  . BREAST LUMPECTOMY Left 1989, 1991   fibroadenoma  . CESAREAN SECTION  2001  . EXPLORATORY LAPAROTOMY  1994   Endometriosis  . OOPHORECTOMY Left 1996  . ORIF ANKLE FRACTURE  02/21/2012   Procedure: OPEN REDUCTION INTERNAL FIXATION (ORIF) ANKLE FRACTURE;  Surgeon: Johnny Bridge, MD;  Location: [facility];  Service: Orthopedics;  Laterality: Right;  . SHOULDER ARTHROSCOPY Right 2012   Family History  Problem Relation Age of Onset  . Heart disease Mother   . Arthritis  Mother   . Hypertension Mother   . Fibroids Mother   . Mental illness Father   . Heart disease Father   . Osteosarcoma Father     22  . Hypertension Father   . Arthritis Maternal Grandmother   . Heart disease Maternal Grandmother   . Heart disease Maternal Grandfather   . Early death Maternal Grandfather   . Heart disease Paternal Grandmother   . Heart disease Paternal Grandfather   . Early death Paternal Grandfather    Social History   Social History  . Marital status: Married    Spouse name: N/A  . Number of children: N/A  . 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 Smoking status: Never Smoker  . Smokeless tobacco: Never Used  . Alcohol use Yes     Comment: occasional  . Drug use: No  . Sexual activity: Yes    Partners: Male    Birth control/ protection: Pill     Comment: Married   Other Topics Concern  . Not on file   Social History Narrative   Stanton Kidney to [place]. 2 children named Wells Guiles and [place].   Masters degree, employed as a Glass blower/designer.   Drinks caffeine, takes herbal remedies, takes a daily vitamin.   Wears her seatbelt, wears a bicycle helmet, smoke detector in the home.   Exercises routinely.   Feels safe in her  relationships.     Medication List  Accurate as of 07/13/16 10:55 AM. Always use your most recent med list.          fish oil-omega-3 fatty acids 1000 MG capsule Take 1 g by mouth daily.   Green Tea 200 MG Caps Take by mouth.   loratadine-pseudoephedrine 5-120 MG tablet Commonly known as:  CLARITIN-D 12-hour Take 1 tablet by mouth 2 (two) times daily as needed. For seasonal allergies   MAGNESIUM GLYCINATE PLUS PO Take 1 capsule by mouth daily.   multivitamin with minerals Tabs tablet Take 1 tablet by mouth daily.   NECON 1/50 (28) 1-50 MG-MCG tablet Generic drug:  Norethindrone-Mestranol   TURMERIC PO Take  1,300 mg by mouth.   Vitamin D-3 5000 units Tabs Take 2,500 Units by mouth daily.        No results found for this or any previous visit (from the past 2160 hour(s)).  Dg Shoulder Left  Result Date: 03/23/2016 CLINICAL DATA:  Clinical suspicion of partial tear of the rotator cuff, evaluation for avulsion fracture. EXAM: LEFT SHOULDER - 2+ VIEW COMPARISON:  None in PACs FINDINGS: The bones are subjectively adequately mineralized. The glenohumeral and AC joints are reasonably well-maintained. The subacromial subdeltoid space is normal. No acute fracture or dislocation is observed. IMPRESSION: No acute avulsion fracture nor other acute bony abnormality is demonstrated. There is no significant osteoarthritic change. Electronically Signed   By: David  Martinique M.D.   On: 03/23/2016 13:31    ROS: 14 pt review of systems performed and negative (unless mentioned in an HPI)  Objective: BP 133/83 (BP Location: Left Arm, Patient Position: Sitting, Cuff Size: Normal)   Pulse 73   Temp 97.9 F (36.6 C)   Resp 20   Ht 5\' 5"  (1.651 m)   Wt 153 lb 4 oz (69.5 kg)   SpO2 98%   BMI 25.50 kg/m   Gen: Afebrile. No acute distress. Nontoxic in appearance, well developed, well nourished, caucasian female.  HENT: AT. [place]. Bilateral TM visualized and normal in appearance, have air fluid levels bilaterally. MMM. Bilateral nares with erythema and swelling. Throat without erythema or exudates. No cough or hoarseness on exam.  Eyes:Pupils Equal Round Reactive to light, Extraocular movements intact,  Conjunctiva without redness, discharge or icterus. Neck/lymp/endocrine: Supple,no lymphadenopathy, no thyromegaly CV: RRR no murmur, no edema, +2/4 P posterior tibialis pulses Chest: CTAB, no wheeze or crackles Abd: Soft. Flat . NTND. BS present. No  Masses palpated. No hepatosplenomegaly.  Skin: no rashes, purpura or petechiae. WWW. Skin intact. Neuro/MSK: Normal gait. PERLA. EOMi. Alert. Oriented. Cranial nerves II  through XII intact. Muscle strength 5/5 upper and lower extremity. DTRs equal bilaterally. Psych: Normal affect, dress and demeanor. Normal speech. Normal thought content and judgment..   Assessment/plan: Alexandra Mcguire is a 45 y.o. female present for CPE Encounter for preventative adult health care examination Pt encouraged to eat a healthy diet full of Fresh fruits and vegetables. Continue exercising routinely, > 150 minutes a week. AVS on health maintenance recommendations provided.  Colonoscopy: no fhx, screen 50 Mammogram: No Fhx. Completed "a couple of years ago". No fhx, performs SBE.  Cervical cancer screening: Dr. Corinna Capra (GYN) PAP last year 12/2014 and normal. Ordered at breast center. Immunizations: tdap 08/2007, Influenza UTD 2017(encouraged yearly) Infectious disease screening: HIV completed 2016 - Patient has significant family history of cardiovascular disease with early death. - Patient has a BMI just barely over 25 she is very athletic and muscular. She is a Physiological scientist and  exercises daily.  BMI 25.0-25.9,adult Family history of heart disease Vitamin D deficiency Diabetes mellitus screening Screening cholesterol level Thyroid disorder screen Screening for deficiency anemia Breast cancer screening - MM DIGITAL SCREENING BILATERAL; Future - CBC, CMP, Lipid panel, a1c, TSH and Vit d collected today.    Electronically signed by: Howard Pouch, DO [place]

## 2016-07-14 NOTE — Telephone Encounter (Signed)
Patient notified and verbalized understanding. 

## 2016-07-17 ENCOUNTER — Encounter: Payer: Self-pay | Admitting: Family Medicine

## 2016-07-19 ENCOUNTER — Other Ambulatory Visit: Payer: Self-pay | Admitting: Family Medicine

## 2016-07-19 MED ORDER — NECON 1/50 (28) 1-50 MG-MCG PO TABS: 1.0000 | ORAL_TABLET | Freq: Every day | ORAL | 11 refills | Status: DC

## 2016-07-19 MED FILL — NECON 1-50-28 TABLET: 1-50 | 28 days supply | Qty: 28 | Fill #0

## 2016-07-19 NOTE — Progress Notes (Signed)
See echart message. Pt counseled on increase risk of heart attacks, stroke and DVT with use, especially with FHX heart disease. She has been on medication through Gynecology and would like to continue.

## 2016-07-29 ENCOUNTER — Ambulatory Visit: Payer: 59

## 2016-08-11 MED FILL — NECON 1-50-28 TABLET: 1-50 | 28 days supply | Qty: 28 | Fill #1

## 2016-08-25 ENCOUNTER — Ambulatory Visit
Admission: RE | Admit: 2016-08-25 | Discharge: 2016-08-25 | Disposition: A | Discharge status: Home / Self Care | Payer: 59 | Source: Ambulatory Visit | Attending: Family Medicine | Admitting: Family Medicine

## 2016-08-25 DIAGNOSIS — Z1239 Encounter for other screening for malignant neoplasm of breast: Secondary | ICD-10-CM

## 2016-08-25 DIAGNOSIS — Z1231 Encounter for screening mammogram for malignant neoplasm of breast: Secondary | ICD-10-CM | POA: Diagnosis not present

## 2016-12-06 DIAGNOSIS — S93492A Sprain of other ligament of left ankle, initial encounter: Secondary | ICD-10-CM | POA: Diagnosis not present

## 2017-02-04 ENCOUNTER — Ambulatory Visit (INDEPENDENT_AMBULATORY_CARE_PROVIDER_SITE_OTHER): Payer: 59 | Admitting: Internal Medicine

## 2017-02-04 DIAGNOSIS — Z789 Other specified health status: Secondary | ICD-10-CM | POA: Diagnosis not present

## 2017-02-04 DIAGNOSIS — Z9189 Other specified personal risk factors, not elsewhere classified: Secondary | ICD-10-CM

## 2017-02-04 DIAGNOSIS — Z7184 Encounter for health counseling related to travel: Secondary | ICD-10-CM

## 2017-02-04 DIAGNOSIS — Z23 Encounter for immunization: Secondary | ICD-10-CM

## 2017-02-04 DIAGNOSIS — Z7189 Other specified counseling: Secondary | ICD-10-CM | POA: Diagnosis not present

## 2017-02-04 MED ORDER — ATOVAQUONE-PROGUANIL HCL 250-100 MG PO TABS: 1.0000 | ORAL_TABLET | Freq: Every day | ORAL | 0 refills | Status: DC

## 2017-02-04 MED ORDER — TYPHOID VACCINE PO CPDR: 1.0000 | DELAYED_RELEASE_CAPSULE | ORAL | 0 refills | Status: DC

## 2017-02-04 MED ORDER — AZITHROMYCIN 500 MG PO TABS
500.0000 mg | ORAL_TABLET | Freq: Every day | ORAL | 0 refills | Status: DC
Start: 2017-02-04 — End: 2018-01-05

## 2017-02-04 MED FILL — VIVOTIF EC CAPSULE: 8 days supply | Qty: 4 | Fill #0

## 2017-02-04 MED FILL — AZITHROMYCIN 500 MG TABLET: 500 | 5 days supply | Qty: 5 | Fill #0

## 2017-02-04 NOTE — Patient Instructions (Signed)
[facility]                [address] [place]                   [place], [place] [address]                      Phone: [phone]                        Fax: [phone]   Planned departure date: July 27     Planned return date: aug 4 Countries of travel: [place]  Guidelines for the Prevention & Treatment of Traveler's Diarrhea  Prevention: "Boil it, Peel it, Lacinda Axon it, or Forget it"   the fewer chances -> lower risk: try to stick to food & water precautions as much as possible"   If it's "piping hot"; it is probably okay, if not, it may not be   Treatment   1) You should always take care to drink lots of fluids in order to avoid dehydration   2) You should bring medications with you in case you come down with a case of diarrhea   3) OTC = bring pepto-bismol - can take with initial abdominal symptoms;                    Imodium - can help slow down your intestinal tract, can help relief cramps                    and diarrhea, can take if no bloody diarrhea  Use azithro if needed for traveler's diarrhea  Guidelines for the Prevention of Malaria  Avoidance:  -fewer mosquito bites = lower risk. Mosquitos can bite at night as well as daytime  -cover up (long sleeve clothing), mosquito nets, screens  -Insect repellent for your skin ( DEET containing lotion > 20%): for clothes ( permethrin spray)   On July 25, start malarone for malaria prevention.   Immunizations received today: hep A and tdap and oral typhoid Future immunizations, if indicated   Prior to travel:  1) Be sure to pick up appropriate prescriptions, including medicine you take daily. Do not expect to be able to fill your prescriptions abroad.  2) Strongly consider obtaining traveler's insurance, including emergency evacuation insurance. Most plans in the [place] do not cover participants abroad. (see below for resources)  3) Register at the appropriate [place] embassy or  consulate with travel dates so they are aware of your presence in-country and for helpful advice during travel using the [employer] (STEP, GreenNylon.com.cy).  4) Leave contact information with a relative or friend.  5) Keep a Research officer, political party, credit cards in case they become lost or stolen  6) Inform your credit card company that you will be travelling abroad   During travel:  1) If you become ill and need medical advice, the [place] [employer]	[place] of the country you are traveling in general provides a list of [place] speaking doctors.  We are also available on MyChart for remote consultation if you register prior to travel. 2) Avoid motorcycles or scooters when at all possible. Traffic laws in many countries are lax and accidents occur frequently.  3) Do not take any unnecessary risks that you wouldn't do at home.   Resources:  -Country specific information: BlindResource.ca or GreenNylon.com.cy  -Travel  Supplies (DEET, mosquito nets): [employer], [employer] store, [employer], Bag[employer] insurance options: [place].com; http://clayton-rivera.info/; travelguard.com or [employer], gninsurance.com or info@gninsurance .com, (425)318-8792.   Post Travel:  If you return from your trip ill, call your primary care doctor or our travel clinic @ [phone].   Enjoy your trip and know that with proper pre-travel preparation, most people have an enjoyable and uninterrupted trip!

## 2017-02-04 NOTE — Progress Notes (Signed)
Subjective:   Alexandra Mcguire is a 46 y.o. female who presents to the Infectious Disease clinic for travel consultation. Planned departure date: july27   Planned return date: aug 4 Countries of travel: : [place] Areas in country: rural Accommodations: dormitory Purpose of travel: mission trip Prior travel out of [place]: yes, in 2005, [place]     Objective:   Medications: mvi, fish oil OAC:ZYSAY    Assessment:   No contraindications to travel. none     Plan:    pre travel vaccines = will give hep A, tdap and oral typhoid  Malaria proph = unclear if she is in an area endemic to malaria. Gave rx for malarone to use if it looks like it is endemic area when she confirms with NGO. Gave mosquito bite precautions  Traveler's diarrhea = gave rx for azithro to use if needed

## 2017-05-20 DIAGNOSIS — Z23 Encounter for immunization: Secondary | ICD-10-CM | POA: Diagnosis not present

## 2017-05-25 ENCOUNTER — Ambulatory Visit: Payer: Self-pay | Admitting: Family Medicine

## 2017-09-08 DIAGNOSIS — Z0100 Encounter for examination of eyes and vision without abnormal findings: Secondary | ICD-10-CM | POA: Diagnosis not present

## 2018-01-02 ENCOUNTER — Encounter: Payer: 59 | Admitting: Family Medicine

## 2018-01-02 DIAGNOSIS — Z0289 Encounter for other administrative examinations: Secondary | ICD-10-CM

## 2018-01-05 ENCOUNTER — Encounter: Payer: Self-pay | Admitting: Family Medicine

## 2018-01-05 ENCOUNTER — Other Ambulatory Visit (HOSPITAL_COMMUNITY)
Admission: RE | Admit: 2018-01-05 | Discharge: 2018-01-05 | Disposition: A | Discharge status: Home / Self Care | Payer: 59 | Source: Ambulatory Visit | Attending: Family Medicine | Admitting: Family Medicine

## 2018-01-05 ENCOUNTER — Ambulatory Visit (INDEPENDENT_AMBULATORY_CARE_PROVIDER_SITE_OTHER): Payer: 59 | Admitting: Family Medicine

## 2018-01-05 VITALS — BP 130/85 | HR 66 | Temp 98.1°F | Resp 20 | Ht 65.0 in | Wt 151.5 lb

## 2018-01-05 DIAGNOSIS — E559 Vitamin D deficiency, unspecified: Secondary | ICD-10-CM

## 2018-01-05 DIAGNOSIS — Z1151 Encounter for screening for human papillomavirus (HPV): Secondary | ICD-10-CM | POA: Diagnosis not present

## 2018-01-05 DIAGNOSIS — Z0000 Encounter for general adult medical examination without abnormal findings: Secondary | ICD-10-CM | POA: Diagnosis not present

## 2018-01-05 DIAGNOSIS — Z130 Encounter for screening for diseases of the blood and blood-forming organs and certain disorders involving the immune mechanism: Secondary | ICD-10-CM

## 2018-01-05 DIAGNOSIS — Z124 Encounter for screening for malignant neoplasm of cervix: Secondary | ICD-10-CM | POA: Insufficient documentation

## 2018-01-05 DIAGNOSIS — E663 Overweight: Secondary | ICD-10-CM | POA: Diagnosis not present

## 2018-01-05 DIAGNOSIS — Z01419 Encounter for gynecological examination (general) (routine) without abnormal findings: Secondary | ICD-10-CM | POA: Diagnosis not present

## 2018-01-05 DIAGNOSIS — Z131 Encounter for screening for diabetes mellitus: Secondary | ICD-10-CM | POA: Diagnosis not present

## 2018-01-05 DIAGNOSIS — Z1231 Encounter for screening mammogram for malignant neoplasm of breast: Secondary | ICD-10-CM | POA: Diagnosis not present

## 2018-01-05 DIAGNOSIS — Z1239 Encounter for other screening for malignant neoplasm of breast: Secondary | ICD-10-CM

## 2018-01-05 NOTE — Patient Instructions (Signed)

## 2018-01-05 NOTE — Progress Notes (Signed)
Patient ID: Alexandra Mcguire, female  DOB: Aug 24, 1970, 47 y.o.   MRN: [id number] Patient Care Team    Relationship Specialty Notifications Start End  Ma Hillock, DO PCP - General Family Medicine  03/26/16   Louretta Shorten, MD Consulting Physician Obstetrics and Gynecology  03/26/16     Chief Complaint  Patient presents with  . Annual Exam  . Gynecologic Exam    Subjective:  Alexandra Mcguire is a 47 y.o. female present for CPE. All past medical history, surgical history, allergies, family history, immunizations, medications and social history were updated in the electronic medical record today. All recent labs, ED visits and hospitalizations within the last year were reviewed.  Well woman exam: Patient reports her menstrual cycles are approximately every 26-31 days.  She denies any dyspareunia, vaginal discharge or irritation.  She is in a monogamous relationship with her husband.  Health maintenance: updated 01/05/2018 Colonoscopy: no fhx, screen 50 Mammogram: No Fhx. 08/25/2016 Birads 1. SBE.  Cervical cancer screening: Dr. Corinna Capra (GYN) PAP last year 12/2014 and normal.  Immunizations: tdap 2018, Influenza UTD 2018(encouraged yearly) Infectious disease screening: HIV completed 2016 DEXA: Never Assistive device: None Oxygen use: None Patient has a Dental home. Hospitalizations/ED visits: None  Depression screen [facility] 2/9 01/05/2018 07/13/2016 03/26/2016  Decreased Interest 0 0 0  Down, Depressed, Hopeless 0 0 0  PHQ - 2 Score 0 0 0   No flowsheet data found.   Current Exercise Habits: Structured exercise class, Type of exercise: strength training/weights;calisthenics, Time (Minutes): 60, Frequency (Times/Week): 5, Weekly Exercise (Minutes/Week): 300, Intensity: Moderate   Fall Risk  01/05/2018 03/26/2016  Falls in the past year? No No    Immunization History  Administered Date(s) Administered  . H1N1 06/12/2008  . Hepatitis A 03/18/2004  . Hepatitis A, Adult 02/04/2017  .  Influenza,inj,Quad PF,6+ Mos 05/20/2017  . Influenza-Unspecified 05/24/2015, 05/23/2016  . Td 03/18/2004, 08/30/2007  . Tdap 08/30/2007, 02/04/2017  . Typhoid Inactivated 03/18/2004  . Typhoid Live 02/04/2017  . Yellow Fever 03/18/2004     Past Medical History:  Diagnosis Date  . Allergy   . Ankle syndesmosis disruption 02/22/2012  . Endometriosis   . Fibroadenoma of breast, left   . Miscarriage 2009   2009  . Ovarian cyst    Allergies  Allergen Reactions  . Sulfa Antibiotics Rash   Past Surgical History:  Procedure Laterality Date  . BREAST LUMPECTOMY Left 1989, 1991   fibroadenoma  . CESAREAN SECTION  2001  . EXPLORATORY LAPAROTOMY  1994   Endometriosis  . OOPHORECTOMY Left 1996  . ORIF ANKLE FRACTURE  02/21/2012   Procedure: OPEN REDUCTION INTERNAL FIXATION (ORIF) ANKLE FRACTURE;  Surgeon: Johnny Bridge, MD;  Location: [place] [facility];  Service: Orthopedics;  Laterality: Right;  . SHOULDER ARTHROSCOPY Right 2012   Family History  Problem Relation Age of Onset  . Heart disease Mother   . Arthritis Mother   . Hypertension Mother   . Fibroids Mother   . Mental illness Father   . Heart disease Father   . Osteosarcoma Father        3  . Hypertension Father   . Arthritis Maternal Grandmother   . Heart disease Maternal Grandmother   . Heart disease Maternal Grandfather   . Early death Maternal Grandfather   . Heart disease Paternal Grandmother   . Heart disease Paternal Grandfather   . Early death Paternal Grandfather    Social History   Socioeconomic History  .  Marital status: Married    Spouse name: Not on file  . Number of children: Not on file  . Years of education: Not on file  . Highest education level: Not on file  Occupational History  . Occupation: Physiological scientist  . Occupation: Designer, jewellery  Social Needs  . Financial resource strain: Not on file  . Food insecurity:    Worry: Not on file    Inability: Not on file  . Transportation needs:     Medical: Not on file    Non-medical: Not on file  Tobacco Use  . Smoking status: Never Smoker  . Smokeless tobacco: Never Used  Substance and Sexual Activity  . Alcohol use: Yes    Comment: occasional  . Drug use: No  . Sexual activity: Yes    Partners: Male    Birth control/protection: Pill    Comment: Married  Lifestyle  . Physical activity:    Days per week: Not on file    Minutes per session: Not on file  . Stress: Not on file  Relationships  . Social connections:    Talks on phone: Not on file    Gets together: Not on file    Attends religious service: Not on file    Active member of club or organization: Not on file    Attends meetings of clubs or organizations: Not on file    Relationship status: Not on file  . Intimate partner violence:    Fear of current or ex partner: Not on file    Emotionally abused: Not on file    Physically abused: Not on file    Forced sexual activity: Not on file  Other Topics Concern  . Not on file  Social History Narrative   Stanton Kidney to [place]. 2 children named Wells Guiles and Chestnut.   Masters degree, employed as a Glass blower/designer.   Drinks caffeine, takes herbal remedies, takes a daily vitamin.   Wears her seatbelt, wears a bicycle helmet, smoke detector in the home.   Exercises routinely.   Feels safe in her  relationships.   Allergies as of 01/05/2018      Reactions   Sulfa Antibiotics Rash      Medication List        Accurate as of 01/05/18 12:47 PM. Always use your most recent med list.          fish oil-omega-3 fatty acids 1000 MG capsule Take 1 g by mouth daily.   MAGNESIUM GLYCINATE PLUS PO Take 1 capsule by mouth daily.   multivitamin with minerals Tabs tablet Take 1 tablet by mouth daily.   TURMERIC PO Take 1,300 mg by mouth.   Vitamin D-3 5000 units Tabs Take 2,500 Units by mouth daily.      All past medical history, surgical history, allergies, family history, immunizations  andmedications were updated in the EMR today and reviewed under the history and medication portions of their EMR.     No results found for this or any previous visit (from the past 2160 hour(s)).  Mm Digital Screening Bilateral  Result Date: 08/25/2016 CLINICAL DATA:  Screening. EXAM: DIGITAL SCREENING BILATERAL MAMMOGRAM WITH CAD COMPARISON:  Previous exam(s). ACR Breast Density Category b: There are scattered areas of fibroglandular density. FINDINGS: There are no findings suspicious for malignancy. Images were processed with CAD. IMPRESSION: No mammographic evidence of malignancy. A result letter of this screening mammogram will be mailed directly to the patient. RECOMMENDATION: Screening mammogram in one year. (Code:SM-B-01Y)  BI-RADS CATEGORY  1: Negative. Electronically Signed   By: Lajean Manes M.D.   On: 08/27/2016 08:24     ROS: 14 pt review of systems performed and negative (unless mentioned in an HPI)  Objective: BP 130/85 (BP Location: Left Arm, Patient Position: Sitting, Cuff Size: Normal)   Pulse 66   Temp 98.1 F (36.7 C)   Resp 20   Ht '5\' 5"'  (1.651 m)   Wt 151 lb 8 oz (68.7 kg)   SpO2 99%   BMI 25.21 kg/m  Gen: Afebrile. No acute distress. Nontoxic in appearance, well-developed, well-nourished, pleasant, Caucasian female.  HENT: AT. [place]. Bilateral TM visualized and normal in appearance, normal external auditory canal. MMM, no oral lesions, adequate dentition. Bilateral nares within normal limits. Throat without erythema, ulcerations or exudates.  No cough on exam, no hoarseness on exam. Eyes:Pupils Equal Round Reactive to light, Extraocular movements intact,  Conjunctiva without redness, discharge or icterus. Neck/lymp/endocrine: Supple, no lymphadenopathy, no thyromegaly CV: RRR no murmur, no edema, +2/4 P posterior tibialis pulses.  No carotid bruits. No JVD. Chest: CTAB, no wheeze, rhonchi or crackles.  Normal respiratory effort.  Good air movement. Abd: Soft.  Flat.  NTND. BS present.  No masses palpated. No hepatosplenomegaly. No rebound tenderness or guarding. Skin: No rashes, purpura or petechiae. Warm and well-perfused. Skin intact. Neuro/Msk:  Normal gait. PERLA. EOMi. Alert. Oriented x3.  Cranial nerves II through XII intact. Muscle strength 5/5 upper/lower extremity. DTRs equal bilaterally. Psych: Normal affect, dress and demeanor. Normal speech. Normal thought content and judgment. Breasts: breasts appear normal, symmetrical, no tenderness on exam, no suspicious masses, no skin or nipple changes or axillary nodes. GYN:  External genitalia within normal limits, normal hair distribution, no lesions. Urethral meatus normal, no lesions. Vaginal mucosa pink, moist, normal rugae, no lesions. No cystocele or rectocele. cervix dark purple/black lesion at 3 o'clock position, no discharge. Bimanual exam revealed normal uterus.  No bladder/suprapubic fullness, masses or tenderness. No cervical motion tenderness. No adnexal fullness. Anus and perineum within normal limits, no lesions, x1 external hemorrhoid.   No exam data present  Assessment/plan: Alexandra Mcguire is a 47 y.o. female present for CPE Encounter for preventative adult health care examination Patient was encouraged to exercise greater than 150 minutes a week. Patient was encouraged to choose a diet filled with fresh fruits and vegetables, and lean meats. AVS provided to patient today for education/recommendation on gender specific health and safety maintenance. Colonoscopy: no fhx, screen 50 Mammogram: No Fhx. 08/25/2016 Birads 1. SBE.  Cervical cancer screening: Dr. Corinna Capra (GYN) PAP last year 12/2014 and normal.  Immunizations: tdap 2018, Influenza UTD 2018(encouraged yearly) Infectious disease screening: HIV completed 2016 DEXA: Never - Comp Met (CMET); Future Screening for deficiency anemia - CBC w/Diff; Future Vitamin D deficiency -Currently taking 2500 units daily of vitamin D3 - Vitamin D (25  hydroxy); Future Overweight (BMI 25.0-29.9) Patient exercises routinely - Lipid panel; Future Encounter for routine gynecological examination with Papanicolaou smear of cervix - Cytology - PAP with HPV testing collected today Diabetes mellitus screening - HgB A1c; Future Breast cancer screening - MM 3D SCREEN BREAST BILATERAL; Future   Return in about 1 year (around 01/06/2019) for CPE.  Note is dictated utilizing voice recognition software. Although note has been proof read prior to signing, occasional typographical errors still can be missed. If any questions arise, please do not hesitate to call for verification.  Electronically signed by: Howard Pouch, DO [place]

## 2018-01-06 LAB — CYTOLOGY - PAP
Diagnosis: NEGATIVE
HPV: NOT DETECTED

## 2018-01-11 ENCOUNTER — Telehealth: Payer: Self-pay | Admitting: Family Medicine

## 2018-01-11 NOTE — Telephone Encounter (Signed)
Attempted to call pt this morning to discuss her PAP, which was negative/normal with negative HPV co-testing.  However, I did want to speak with her personally about her PAP, since I did see a possible abnormality on visualization of truly unknown significance.  I was waiting to call her until after we also had her labs, but I see she has not had them collected yet. If she can I can discuss her PAP with her when she has her labs collected this week. Put her in a 15 min slot with me at her lab time to hold the slot for her.   Please try to call her to make her aware of normal PAP, but we are making time to see her briefly to discuss my findings and plan.

## 2018-01-11 NOTE — Telephone Encounter (Signed)
Left detailed message with results and instructions on patient voice mail per DPR scheduled patient to see Dr Raoul Pitch after lab draw.

## 2018-01-12 ENCOUNTER — Ambulatory Visit (INDEPENDENT_AMBULATORY_CARE_PROVIDER_SITE_OTHER): Payer: 59 | Admitting: Family Medicine

## 2018-01-12 ENCOUNTER — Other Ambulatory Visit (INDEPENDENT_AMBULATORY_CARE_PROVIDER_SITE_OTHER): Payer: 59

## 2018-01-12 ENCOUNTER — Encounter: Payer: Self-pay | Admitting: Family Medicine

## 2018-01-12 VITALS — BP 128/85 | HR 72 | Resp 20 | Ht 65.0 in | Wt 151.0 lb

## 2018-01-12 DIAGNOSIS — Z130 Encounter for screening for diseases of the blood and blood-forming organs and certain disorders involving the immune mechanism: Secondary | ICD-10-CM | POA: Diagnosis not present

## 2018-01-12 DIAGNOSIS — Z0000 Encounter for general adult medical examination without abnormal findings: Secondary | ICD-10-CM

## 2018-01-12 DIAGNOSIS — E559 Vitamin D deficiency, unspecified: Secondary | ICD-10-CM | POA: Diagnosis not present

## 2018-01-12 DIAGNOSIS — Z01411 Encounter for gynecological examination (general) (routine) with abnormal findings: Secondary | ICD-10-CM

## 2018-01-12 DIAGNOSIS — Z131 Encounter for screening for diabetes mellitus: Secondary | ICD-10-CM | POA: Diagnosis not present

## 2018-01-12 DIAGNOSIS — E663 Overweight: Secondary | ICD-10-CM

## 2018-01-12 LAB — COMPREHENSIVE METABOLIC PANEL
ALK PHOS: 45 U/L (ref 39–117)
ALT: 18 U/L (ref 0–35)
AST: 21 U/L (ref 0–37)
Albumin: 4.3 g/dL (ref 3.5–5.2)
BILIRUBIN TOTAL: 0.7 mg/dL (ref 0.2–1.2)
BUN: 19 mg/dL (ref 6–23)
CO2: 25 mEq/L (ref 19–32)
Calcium: 9.5 mg/dL (ref 8.4–10.5)
Chloride: 105 mEq/L (ref 96–112)
Creatinine, Ser: 0.95 mg/dL (ref 0.40–1.20)
GFR: 66.97 mL/min (ref >60.00)
Glucose, Bld: 98 mg/dL (ref 70–99)
Potassium: 4.3 mEq/L (ref 3.5–5.1)
SODIUM: 138 meq/L (ref 135–145)
TOTAL PROTEIN: 7 g/dL (ref 6.0–8.3)

## 2018-01-12 LAB — CBC WITH DIFFERENTIAL/PLATELET
BASOS ABS: 0.2 10*3/uL — AB (ref 0.0–0.1)
Basophils Relative: 3.3 % — ABNORMAL HIGH (ref 0.0–3.0)
EOS ABS: 0.2 10*3/uL (ref 0.0–0.7)
Eosinophils Relative: 3.2 % (ref 0.0–5.0)
HCT: 42.9 % (ref 36.0–46.0)
Hemoglobin: 14.2 g/dL (ref 12.0–15.0)
LYMPHS ABS: 1.8 10*3/uL (ref 0.7–4.0)
Lymphocytes Relative: 38.2 % (ref 12.0–46.0)
MCHC: 33.2 g/dL (ref 30.0–36.0)
MCV: 99.2 fl (ref 78.0–100.0)
Monocytes Absolute: 0.4 10*3/uL (ref 0.1–1.0)
Monocytes Relative: 8.9 % (ref 3.0–12.0)
NEUTROS ABS: 2.2 10*3/uL (ref 1.4–7.7)
NEUTROS PCT: 46.4 % (ref 43.0–77.0)
PLATELETS: 231 10*3/uL (ref 150.0–400.0)
RBC: 4.32 Mil/uL (ref 3.87–5.11)
RDW: 13.6 % (ref 11.5–15.5)
WBC: 4.7 10*3/uL (ref 4.0–10.5)

## 2018-01-12 LAB — LIPID PANEL
Cholesterol: 178 mg/dL (ref 0–200)
HDL: 72.9 mg/dL (ref >39.00)
LDL CALC: 98 mg/dL (ref 0–99)
NONHDL: 105.15
Total CHOL/HDL Ratio: 2
Triglycerides: 35 mg/dL (ref 0.0–149.0)
VLDL: 7 mg/dL (ref 0.0–40.0)

## 2018-01-12 LAB — VITAMIN D 25 HYDROXY (VIT D DEFICIENCY, FRACTURES): VITD: 45.82 ng/mL (ref 30.00–100.00)

## 2018-01-12 LAB — HEMOGLOBIN A1C: HEMOGLOBIN A1C: 5.3 % (ref 4.6–6.5)

## 2018-01-12 NOTE — Progress Notes (Signed)
Alexandra Mcguire , [birth year], 47 y.o., female MRN: [id number] Patient Care Team    Relationship Specialty Notifications Start End  Ma Hillock, DO PCP - General Family Medicine  03/26/16   Louretta Shorten, MD Consulting Physician Obstetrics and Gynecology  03/26/16     Chief Complaint  Patient presents with  . Results     Subjective:  Pt present for lab collection today. Reviewed PAP results for her in a room for privacy.   Depression screen [facility] 2/9 01/05/2018 07/13/2016 03/26/2016  Decreased Interest 0 0 0  Down, Depressed, Hopeless 0 0 0  PHQ - 2 Score 0 0 0    Allergies  Allergen Reactions  . Sulfa Antibiotics Rash   Social History   Tobacco Use  . Smoking status: Never Smoker  . Smokeless tobacco: Never Used  Substance Use Topics  . Alcohol use: Yes    Comment: occasional   Past Medical History:  Diagnosis Date  . Allergy   . Ankle syndesmosis disruption 02/22/2012  . Endometriosis   . Fibroadenoma of breast, left   . Miscarriage 2009   2009  . Ovarian cyst    Past Surgical History:  Procedure Laterality Date  . BREAST LUMPECTOMY Left 1989, 1991   fibroadenoma  . CESAREAN SECTION  2001  . EXPLORATORY LAPAROTOMY  1994   Endometriosis  . OOPHORECTOMY Left 1996  . ORIF ANKLE FRACTURE  02/21/2012   Procedure: OPEN REDUCTION INTERNAL FIXATION (ORIF) ANKLE FRACTURE;  Surgeon: Johnny Bridge, MD;  Location: [facility];  Service: Orthopedics;  Laterality: Right;  . SHOULDER ARTHROSCOPY Right 2012   Family History  Problem Relation Age of Onset  . Heart disease Mother   . Arthritis Mother   . Hypertension Mother   . Fibroids Mother   . Mental illness Father   . Heart disease Father   . Osteosarcoma Father        62  . Hypertension Father   . Arthritis Maternal Grandmother   . Heart disease Maternal Grandmother   . Heart disease Maternal Grandfather   . Early death Maternal Grandfather   . Heart disease Paternal Grandmother   . Heart disease Paternal  Grandfather   . Early death Paternal Grandfather    Allergies as of 01/12/2018      Reactions   Sulfa Antibiotics Rash      Medication List        Accurate as of 01/12/18  9:13 AM. Always use your most recent med list.          fish oil-omega-3 fatty acids 1000 MG capsule Take 1 g by mouth daily.   MAGNESIUM GLYCINATE PLUS PO Take 1 capsule by mouth daily.   multivitamin with minerals Tabs tablet Take 1 tablet by mouth daily.   TURMERIC PO Take 1,300 mg by mouth.   Vitamin D-3 5000 units Tabs Take 2,500 Units by mouth daily.       All past medical history, surgical history, allergies, family history, immunizations andmedications were updated in the EMR today and reviewed under the history and medication portions of their EMR.     ROS: Negative, with the exception of above mentioned in HPI   Objective:  BP 128/85 (BP Location: Left Arm, Patient Position: Sitting, Cuff Size: Normal)   Pulse 72   Resp 20   Ht 5\' 5"  (1.651 m)   Wt 151 lb (68.5 kg)   SpO2 100%   BMI 25.13 kg/m  Body mass index is  25.13 kg/m. Gen: Afebrile. No acute distress. Nontoxic in appearance, well developed, well nourished.  Psych: Normal affect, dress and demeanor. Normal speech. Normal thought content and judgment.  No exam data present No results found. No results found for this or any previous visit (from the past 24 hour(s)).  Assessment/Plan: Alexandra Mcguire is a 47 y.o. female present for OV for  Abnormal gynecological examination No charge for visit today.  I wanted to speak in person to her briefly, since she was here for her lab collection, concerning her cervical exam. Her PAP was normal with negative HPV. It was explained to her today in person I did visualize a small dark purple lesion around the 3 o'clock position. She reports she did have endometriosis when she was younger. She does not have post-coital bleeding, occasional dyspareunia, but not very often. Encouraged her to  follow up with GYN for further eval to be safe. She is in agreement.     Reviewed expectations re: course of current medical issues.  Discussed self-management of symptoms.  Outlined signs and symptoms indicating need for more acute intervention.  Patient verbalized understanding and all questions were answered.  Patient received an After-Visit Summary.    No orders of the defined types were placed in this encounter.    Note is dictated utilizing voice recognition software. Although note has been proof read prior to signing, occasional typographical errors still can be missed. If any questions arise, please do not hesitate to call for verification.   electronically signed by:  Howard Pouch, DO  [place]

## 2018-02-07 DIAGNOSIS — N889 Noninflammatory disorder of cervix uteri, unspecified: Secondary | ICD-10-CM | POA: Diagnosis not present

## 2018-06-06 DIAGNOSIS — Z23 Encounter for immunization: Secondary | ICD-10-CM | POA: Diagnosis not present

## 2018-06-23 DIAGNOSIS — M7542 Impingement syndrome of left shoulder: Secondary | ICD-10-CM | POA: Diagnosis not present

## 2018-06-23 MED FILL — MELOXICAM 15 MG TABLET: 15 | 30 days supply | Qty: 30 | Fill #0

## 2018-07-14 DIAGNOSIS — M7542 Impingement syndrome of left shoulder: Secondary | ICD-10-CM | POA: Diagnosis not present

## 2018-07-27 DIAGNOSIS — M25512 Pain in left shoulder: Secondary | ICD-10-CM | POA: Diagnosis not present

## 2018-07-31 DIAGNOSIS — M25512 Pain in left shoulder: Secondary | ICD-10-CM | POA: Diagnosis not present

## 2018-08-03 MED FILL — MELOXICAM 15 MG TABLET: 15 | 30 days supply | Qty: 30 | Fill #1

## 2018-08-22 ENCOUNTER — Other Ambulatory Visit: Payer: Self-pay

## 2018-08-22 ENCOUNTER — Encounter (HOSPITAL_BASED_OUTPATIENT_CLINIC_OR_DEPARTMENT_OTHER): Payer: Self-pay | Admitting: *Deleted

## 2018-08-28 ENCOUNTER — Other Ambulatory Visit: Payer: Self-pay | Admitting: Orthopedic Surgery

## 2018-08-31 ENCOUNTER — Ambulatory Visit (HOSPITAL_BASED_OUTPATIENT_CLINIC_OR_DEPARTMENT_OTHER): Payer: 59 | Admitting: Anesthesiology

## 2018-08-31 ENCOUNTER — Other Ambulatory Visit: Payer: Self-pay

## 2018-08-31 ENCOUNTER — Ambulatory Visit (HOSPITAL_BASED_OUTPATIENT_CLINIC_OR_DEPARTMENT_OTHER)
Admission: RE | Admit: 2018-08-31 | Discharge: 2018-08-31 | Disposition: A | Discharge status: Home / Self Care | Payer: 59 | Source: Ambulatory Visit | Attending: Orthopedic Surgery | Admitting: Orthopedic Surgery

## 2018-08-31 ENCOUNTER — Encounter (HOSPITAL_BASED_OUTPATIENT_CLINIC_OR_DEPARTMENT_OTHER)
Admission: RE | Disposition: A | Discharge status: Home / Self Care | Payer: Self-pay | Source: Ambulatory Visit | Attending: Orthopedic Surgery

## 2018-08-31 ENCOUNTER — Encounter (HOSPITAL_BASED_OUTPATIENT_CLINIC_OR_DEPARTMENT_OTHER): Payer: Self-pay | Admitting: *Deleted

## 2018-08-31 DIAGNOSIS — M7542 Impingement syndrome of left shoulder: Secondary | ICD-10-CM | POA: Insufficient documentation

## 2018-08-31 DIAGNOSIS — G8918 Other acute postprocedural pain: Secondary | ICD-10-CM | POA: Diagnosis not present

## 2018-08-31 DIAGNOSIS — M75102 Unspecified rotator cuff tear or rupture of left shoulder, not specified as traumatic: Secondary | ICD-10-CM | POA: Diagnosis not present

## 2018-08-31 DIAGNOSIS — Z79899 Other long term (current) drug therapy: Secondary | ICD-10-CM | POA: Diagnosis not present

## 2018-08-31 DIAGNOSIS — M75122 Complete rotator cuff tear or rupture of left shoulder, not specified as traumatic: Secondary | ICD-10-CM | POA: Diagnosis not present

## 2018-08-31 DIAGNOSIS — M24112 Other articular cartilage disorders, left shoulder: Secondary | ICD-10-CM | POA: Diagnosis not present

## 2018-08-31 DIAGNOSIS — Z882 Allergy status to sulfonamides status: Secondary | ICD-10-CM | POA: Diagnosis not present

## 2018-08-31 DIAGNOSIS — S46012A Strain of muscle(s) and tendon(s) of the rotator cuff of left shoulder, initial encounter: Secondary | ICD-10-CM | POA: Diagnosis not present

## 2018-08-31 HISTORY — DX: Other complications of anesthesia, initial encounter: T88.59XA

## 2018-08-31 HISTORY — DX: Unspecified rotator cuff tear or rupture of left shoulder, not specified as traumatic: M75.102

## 2018-08-31 HISTORY — PX: SHOULDER ARTHROSCOPY WITH ROTATOR CUFF REPAIR AND SUBACROMIAL DECOMPRESSION: SHX5686

## 2018-08-31 HISTORY — DX: Other specified postprocedural states: R11.2

## 2018-08-31 HISTORY — DX: Other specified postprocedural states: Z98.890

## 2018-08-31 HISTORY — DX: Adverse effect of unspecified anesthetic, initial encounter: T41.45XA

## 2018-08-31 SURGERY — SHOULDER ARTHROSCOPY WITH ROTATOR CUFF REPAIR AND SUBACROMIAL DECOMPRESSION
Anesthesia: General | Site: Shoulder | Laterality: Left

## 2018-08-31 MED ORDER — FENTANYL CITRATE (PF) 100 MCG/2ML IJ SOLN: 25.0000 ug | INTRAMUSCULAR | Status: DC | PRN

## 2018-08-31 MED ORDER — FENTANYL CITRATE (PF) 100 MCG/2ML IJ SOLN
INTRAMUSCULAR | Status: AC
  Filled 2018-08-31: qty 2

## 2018-08-31 MED ORDER — DEXAMETHASONE SODIUM PHOSPHATE 4 MG/ML IJ SOLN
INTRAMUSCULAR | Status: DC | PRN
  Administered 2018-08-31: 10 mg via INTRAVENOUS

## 2018-08-31 MED ORDER — SUGAMMADEX SODIUM 200 MG/2ML IV SOLN
INTRAVENOUS | Status: AC
  Filled 2018-08-31: qty 2

## 2018-08-31 MED ORDER — PROMETHAZINE HCL 25 MG/ML IJ SOLN: 6.2500 mg | INTRAMUSCULAR | Status: DC | PRN

## 2018-08-31 MED ORDER — OXYCODONE HCL 5 MG PO TABS: 5.0000 mg | ORAL_TABLET | ORAL | 0 refills | Status: DC | PRN

## 2018-08-31 MED ORDER — ONDANSETRON HCL 4 MG PO TABS: 4.0000 mg | ORAL_TABLET | Freq: Three times a day (TID) | ORAL | 0 refills | Status: DC | PRN

## 2018-08-31 MED ORDER — BACLOFEN 10 MG PO TABS: 10.0000 mg | ORAL_TABLET | Freq: Three times a day (TID) | ORAL | 0 refills | Status: DC

## 2018-08-31 MED ORDER — ROCURONIUM BROMIDE 100 MG/10ML IV SOLN
INTRAVENOUS | Status: DC | PRN
  Administered 2018-08-31: 50 mg via INTRAVENOUS

## 2018-08-31 MED ORDER — SENNA-DOCUSATE SODIUM 8.6-50 MG PO TABS: 2.0000 | ORAL_TABLET | Freq: Every day | ORAL | 1 refills | Status: DC

## 2018-08-31 MED ORDER — LIDOCAINE HCL (CARDIAC) PF 100 MG/5ML IV SOSY
PREFILLED_SYRINGE | INTRAVENOUS | Status: DC | PRN
  Administered 2018-08-31: 40 mg via INTRAVENOUS

## 2018-08-31 MED ORDER — LACTATED RINGERS IV SOLN
INTRAVENOUS | Status: DC
  Administered 2018-08-31: 11:00:00 via INTRAVENOUS

## 2018-08-31 MED ORDER — ROCURONIUM BROMIDE 50 MG/5ML IV SOSY
PREFILLED_SYRINGE | INTRAVENOUS | Status: AC
  Filled 2018-08-31: qty 5

## 2018-08-31 MED ORDER — EPHEDRINE 5 MG/ML INJ
INTRAVENOUS | Status: AC
  Filled 2018-08-31: qty 10

## 2018-08-31 MED ORDER — SODIUM CHLORIDE 0.9 % IR SOLN
Status: DC | PRN
  Administered 2018-08-31: 6000 mL

## 2018-08-31 MED ORDER — SUGAMMADEX SODIUM 200 MG/2ML IV SOLN
INTRAVENOUS | Status: DC | PRN
  Administered 2018-08-31: 200 mg via INTRAVENOUS

## 2018-08-31 MED ORDER — BUPIVACAINE-EPINEPHRINE (PF) 0.25% -1:200000 IJ SOLN
INTRAMUSCULAR | Status: DC | PRN
  Administered 2018-08-31: 15 mL via PERINEURAL

## 2018-08-31 MED ORDER — EPHEDRINE SULFATE 50 MG/ML IJ SOLN
INTRAMUSCULAR | Status: DC | PRN
  Administered 2018-08-31 (×2): 10 mg via INTRAVENOUS

## 2018-08-31 MED ORDER — ONDANSETRON HCL 4 MG/2ML IJ SOLN
INTRAMUSCULAR | Status: AC
  Filled 2018-08-31: qty 2

## 2018-08-31 MED ORDER — MIDAZOLAM HCL 2 MG/2ML IJ SOLN
INTRAMUSCULAR | Status: AC
  Filled 2018-08-31: qty 2

## 2018-08-31 MED ORDER — PROPOFOL 10 MG/ML IV BOLUS
INTRAVENOUS | Status: DC | PRN
  Administered 2018-08-31: 200 mg via INTRAVENOUS

## 2018-08-31 MED ORDER — MIDAZOLAM HCL 2 MG/2ML IJ SOLN
1.0000 mg | INTRAMUSCULAR | Status: DC | PRN
  Administered 2018-08-31: 2 mg via INTRAVENOUS

## 2018-08-31 MED ORDER — SCOPOLAMINE 1 MG/3DAYS TD PT72: 1.0000 | MEDICATED_PATCH | Freq: Once | TRANSDERMAL | Status: DC | PRN

## 2018-08-31 MED ORDER — CHLORHEXIDINE GLUCONATE 4 % EX LIQD: 60.0000 mL | Freq: Once | CUTANEOUS | Status: DC

## 2018-08-31 MED ORDER — DEXAMETHASONE SODIUM PHOSPHATE 10 MG/ML IJ SOLN
INTRAMUSCULAR | Status: AC
  Filled 2018-08-31: qty 1

## 2018-08-31 MED ORDER — CEFAZOLIN SODIUM-DEXTROSE 2-4 GM/100ML-% IV SOLN
2.0000 g | INTRAVENOUS | Status: AC
  Administered 2018-08-31: 2 g via INTRAVENOUS

## 2018-08-31 MED ORDER — FENTANYL CITRATE (PF) 100 MCG/2ML IJ SOLN
50.0000 ug | INTRAMUSCULAR | Status: DC | PRN
  Administered 2018-08-31: 100 ug via INTRAVENOUS

## 2018-08-31 MED ORDER — ONDANSETRON HCL 4 MG/2ML IJ SOLN
INTRAMUSCULAR | Status: DC | PRN
  Administered 2018-08-31: 4 mg via INTRAVENOUS

## 2018-08-31 MED ORDER — BUPIVACAINE LIPOSOME 1.3 % IJ SUSP
INTRAMUSCULAR | Status: DC | PRN
  Administered 2018-08-31: 10 mL via PERINEURAL

## 2018-08-31 MED ORDER — SCOPOLAMINE 1 MG/3DAYS TD PT72
1.0000 | MEDICATED_PATCH | TRANSDERMAL | Status: DC
  Administered 2018-08-31: 1.5 mg via TRANSDERMAL

## 2018-08-31 MED ORDER — CEFAZOLIN SODIUM-DEXTROSE 2-4 GM/100ML-% IV SOLN
INTRAVENOUS | Status: AC
  Filled 2018-08-31: qty 100

## 2018-08-31 MED FILL — BACLOFEN 10 MG TABLET: 10 | 10 days supply | Qty: 30 | Fill #0

## 2018-08-31 MED FILL — oxyCODONE HCL 5 MG TABS: 5 | 3 days supply | Qty: 20 | Fill #0

## 2018-08-31 MED FILL — ONDANSETRON HCL 4 MG TABLET: 4 | 3 days supply | Qty: 10 | Fill #0

## 2018-08-31 SURGICAL SUPPLY — 68 items
ANCH SUT SWLK 19.1X4.75 (Anchor) ×1 IMPLANT
ANCHOR SUT BIO SW 4.75X19.1 (Anchor) ×2 IMPLANT
BLADE CUTTER GATOR 3.5 (BLADE) ×3 IMPLANT
BLADE GREAT WHITE 4.2 (BLADE) IMPLANT
BLADE GREAT WHITE 4.2MM (BLADE)
BLADE SURG 15 STRL LF DISP TIS (BLADE) IMPLANT
BLADE SURG 15 STRL SS (BLADE)
BUR OVAL 6.0 (BURR) IMPLANT
CANNULA 5.75X71 LONG (CANNULA) ×3 IMPLANT
CANNULA TWIST IN 8.25X7CM (CANNULA) ×2 IMPLANT
CLOSURE STERI-STRIP 1/2X4 (GAUZE/BANDAGES/DRESSINGS) ×1
CLSR STERI-STRIP ANTIMIC 1/2X4 (GAUZE/BANDAGES/DRESSINGS) ×2 IMPLANT
COVER WAND RF STERILE (DRAPES) IMPLANT
DECANTER SPIKE VIAL GLASS SM (MISCELLANEOUS) IMPLANT
DRAPE IMP U-DRAPE 54X76 (DRAPES) ×3 IMPLANT
DRAPE INCISE IOBAN 66X45 STRL (DRAPES) ×3 IMPLANT
DRAPE SHOULDER BEACH CHAIR (DRAPES) ×3 IMPLANT
DRAPE U-SHAPE 47X51 STRL (DRAPES) ×3 IMPLANT
DRSG PAD ABDOMINAL 8X10 ST (GAUZE/BANDAGES/DRESSINGS) ×3 IMPLANT
DURAPREP 26ML APPLICATOR (WOUND CARE) ×3 IMPLANT
ELECT REM PT RETURN 9FT ADLT (ELECTROSURGICAL)
ELECTRODE REM PT RTRN 9FT ADLT (ELECTROSURGICAL) IMPLANT
FIBERSTICK 2 (SUTURE) IMPLANT
GAUZE SPONGE 4X4 12PLY STRL (GAUZE/BANDAGES/DRESSINGS) ×3 IMPLANT
GLOVE BIO SURGEON STRL SZ8 (GLOVE) ×3 IMPLANT
GLOVE BIOGEL PI IND STRL 7.0 (GLOVE) IMPLANT
GLOVE BIOGEL PI IND STRL 8 (GLOVE) ×2 IMPLANT
GLOVE BIOGEL PI INDICATOR 7.0 (GLOVE) ×2
GLOVE BIOGEL PI INDICATOR 8 (GLOVE) ×4
GLOVE ECLIPSE 6.5 STRL STRAW (GLOVE) ×2 IMPLANT
GLOVE ORTHO TXT STRL SZ7.5 (GLOVE) ×3 IMPLANT
GOWN STRL REUS W/ TWL LRG LVL3 (GOWN DISPOSABLE) ×1 IMPLANT
GOWN STRL REUS W/ TWL XL LVL3 (GOWN DISPOSABLE) ×2 IMPLANT
GOWN STRL REUS W/TWL LRG LVL3 (GOWN DISPOSABLE) ×3
GOWN STRL REUS W/TWL XL LVL3 (GOWN DISPOSABLE) ×6
IMMOBILIZER SHOULDER FOAM XLGE (SOFTGOODS) IMPLANT
IMPL SPEEDBRIDGE KIT (Orthopedic Implant) IMPLANT
IMPLANT SPEEDBRIDGE KIT (Orthopedic Implant) ×3 IMPLANT
LASSO 90 CVE QUICKPAS (DISPOSABLE) ×2 IMPLANT
MANIFOLD NEPTUNE II (INSTRUMENTS) ×3 IMPLANT
NDL SCORPION MULTI FIRE (NEEDLE) IMPLANT
NEEDLE SCORPION MULTI FIRE (NEEDLE) IMPLANT
PACK ARTHROSCOPY DSU (CUSTOM PROCEDURE TRAY) ×3 IMPLANT
PACK BASIN DAY SURGERY FS (CUSTOM PROCEDURE TRAY) ×3 IMPLANT
PROBE BIPOLAR ATHRO 135MM 90D (MISCELLANEOUS) ×3 IMPLANT
SHEET MEDIUM DRAPE 40X70 STRL (DRAPES) ×3 IMPLANT
SLEEVE SCD COMPRESS KNEE MED (MISCELLANEOUS) ×3 IMPLANT
SLING ARM FOAM STRAP LRG (SOFTGOODS) IMPLANT
SLING ARM IMMOBILIZER LRG (SOFTGOODS) IMPLANT
SLING ARM IMMOBILIZER MED (SOFTGOODS) ×2 IMPLANT
SLING ARM MED ADULT FOAM STRAP (SOFTGOODS) IMPLANT
SLING ARM XL FOAM STRAP (SOFTGOODS) IMPLANT
SUPPORT WRAP ARM LG (MISCELLANEOUS) ×3 IMPLANT
SUT FIBERWIRE #2 38 T-5 BLUE (SUTURE)
SUT MNCRL AB 4-0 PS2 18 (SUTURE) ×3 IMPLANT
SUT PDS AB 1 CT  36 (SUTURE)
SUT PDS AB 1 CT 36 (SUTURE) IMPLANT
SUT TIGER TAPE 7 IN WHITE (SUTURE) ×2 IMPLANT
SUT VIC AB 3-0 SH 27 (SUTURE)
SUT VIC AB 3-0 SH 27X BRD (SUTURE) IMPLANT
SUTURE FIBERWR #2 38 T-5 BLUE (SUTURE) IMPLANT
TAPE FIBER 2MM 7IN #2 BLUE (SUTURE) IMPLANT
TOWEL GREEN STERILE FF (TOWEL DISPOSABLE) ×3 IMPLANT
TOWEL OR NON WOVEN STRL DISP B (DISPOSABLE) ×1 IMPLANT
TUBE CONNECTING 20'X1/4 (TUBING) ×1
TUBE CONNECTING 20X1/4 (TUBING) ×1 IMPLANT
TUBING ARTHRO INFLOW-ONLY STRL (TUBING) ×3 IMPLANT
WATER STERILE IRR 1000ML POUR (IV SOLUTION) ×3 IMPLANT

## 2018-08-31 NOTE — Anesthesia Procedure Notes (Signed)
Anesthesia Regional Block: Interscalene brachial plexus block   Pre-Anesthetic Checklist: ,, timeout performed, Correct Patient, Correct Site, Correct Laterality, Correct Procedure, Correct Position, site marked, Risks and benefits discussed,  Surgical consent,  Pre-op evaluation,  At surgeon's request and post-op pain management  Laterality: Left  Prep: chloraprep       Needles:  Injection technique: Single-shot  Needle Type: Echogenic Stimulator Needle     Needle Length: 5cm  Needle Gauge: 22     Additional Needles:   Narrative:  Start time: 08/31/2018 12:28 PM End time: 08/31/2018 12:38 PM Injection made incrementally with aspirations every 5 mL.  Performed by: Personally  Anesthesiologist: Duane Boston, MD  Additional Notes: Functioning IV was confirmed and monitors applied.  A 30mm 22ga echogenic arrow stimulator was used. Sterile prep and drape,hand hygiene and sterile gloves were used.Ultrasound guidance: relevant anatomy identified, needle position confirmed, local anesthetic spread visualized around nerve(s)., vascular puncture avoided.  Image printed for medical record.  Negative aspiration and negative test dose prior to incremental administration of local anesthetic. The patient tolerated the procedure well.

## 2018-08-31 NOTE — Discharge Instructions (Signed)
Diet: As you were doing prior to hospitalization   Shower:  May shower but keep the wounds dry, use an occlusive plastic wrap, NO SOAKING IN TUB.  If the bandage gets wet, change with a clean dry gauze.  If you have a splint on, leave the splint in place and keep the splint dry with a plastic bag.  Dressing:  You may change your dressing 3-5 days after surgery, unless you have a splint.  If you have a splint, then just leave the splint in place and we will change your bandages during your first follow-up appointment.    If you had hand or foot surgery, we will plan to remove your stitches in about 2 weeks in the office.  For all other surgeries, there are sticky tapes (steri-strips) on your wounds and all the stitches are absorbable.  Leave the steri-strips in place when changing your dressings, they will peel off with time, usually 2-3 weeks.  Activity:  Increase activity slowly as tolerated, but follow the weight bearing instructions below.  The rules on driving is that you can not be taking narcotics while you drive, and you must feel in control of the vehicle.    Weight Bearing:   No use of the left upper extremity except for hygiene.  Okay to use fingers wrist and hand, moving the elbow up and down, but not rotating away or lifting..    To prevent constipation: you may use a stool softener such as -  Colace (over the counter) 100 mg by mouth twice a day  Drink plenty of fluids (prune juice may be helpful) and high fiber foods Miralax (over the counter) for constipation as needed.    Itching:  If you experience itching with your medications, try taking only a single pain pill, or even half a pain pill at a time.  You may take up to 10 pain pills per day, and you can also use benadryl over the counter for itching or also to help with sleep.   Precautions:  If you experience chest pain or shortness of breath - call 911 immediately for transfer to the hospital emergency department!!  If you  develop a fever greater that 101 F, purulent drainage from wound, increased redness or drainage from wound, or calf pain -- Call the office at [phone]                                                Follow- Up Appointment:  Please call for an appointment to be seen in 2 weeks [place] - [phone]   Regional Anesthesia Blocks  1. Numbness or the inability to move the "blocked" extremity may last from 3-48 hours after placement. The length of time depends on the medication injected and your individual response to the medication. If the numbness is not going away after 48 hours, call your surgeon.  2. The extremity that is blocked will need to be protected until the numbness is gone and the  Strength has returned. Because you cannot feel it, you will need to take extra care to avoid injury. Because it may be weak, you may have difficulty moving it or using it. You may not know what position it is in without looking at it while the block is in effect.  3. For blocks in the legs and feet, returning to weight  bearing and walking needs to be done carefully. You will need to wait until the numbness is entirely gone and the strength has returned. You should be able to move your leg and foot normally before you try and bear weight or walk. You will need someone to be with you when you first try to ensure you do not fall and possibly risk injury.  4. Bruising and tenderness at the needle site are common side effects and will resolve in a few days.  5. Persistent numbness or new problems with movement should be communicated to the surgeon or the [place] [phone] [place] [phone]).   Post Anesthesia Home Care Instructions  Activity: Get plenty of rest for the remainder of the day. A responsible individual must stay with you for 24 hours following the procedure.  For the next 24 hours, DO NOT: -Drive a car -Paediatric nurse -Drink alcoholic  beverages -Take any medication unless instructed by your physician -Make any legal decisions or sign important papers.  Meals: Start with liquid foods such as gelatin or soup. Progress to regular foods as tolerated. Avoid greasy, spicy, heavy foods. If nausea and/or vomiting occur, drink only clear liquids until the nausea and/or vomiting subsides. Call your physician if vomiting continues.  Special Instructions/Symptoms: Your throat may feel dry or sore from the anesthesia or the breathing tube placed in your throat during surgery. If this causes discomfort, gargle with warm salt water. The discomfort should disappear within 24 hours.  If you had a scopolamine patch placed behind your ear for the management of post- operative nausea and/or vomiting:  1. The medication in the patch is effective for 72 hours, after which it should be removed.  Wrap patch in a tissue and discard in the trash. Wash hands thoroughly with soap and water. 2. You may remove the patch earlier than 72 hours if you experience unpleasant side effects which may include dry mouth, dizziness or visual disturbances. 3. Avoid touching the patch. Wash your hands with soap and water after contact with the patch.    Information for Discharge Teaching: EXPAREL (bupivacaine liposome injectable suspension)   Your surgeon or anesthesiologist gave you EXPAREL(bupivacaine) to help control your pain after surgery.   EXPAREL is a local anesthetic that provides pain relief by numbing the tissue around the surgical site.  EXPAREL is designed to release pain medication over time and can control pain for up to 72 hours.  Depending on how you respond to EXPAREL, you may require less pain medication during your recovery.  Possible side effects:  Temporary loss of sensation or ability to move in the area where bupivacaine was injected.  Nausea, vomiting, constipation  Rarely, numbness and tingling in your mouth or lips,  lightheadedness, or anxiety may occur.  Call your doctor right away if you think you may be experiencing any of these sensations, or if you have other questions regarding possible side effects.  Follow all other discharge instructions given to you by your surgeon or nurse. Eat a healthy diet and drink plenty of water or other fluids.  If you return to the hospital for any reason within 96 hours following the administration of EXPAREL, it is important for health care providers to know that you have received this anesthetic. A teal colored band has been placed on your arm with the date, time and amount of EXPAREL you have received in order to alert and inform your health care providers. Please leave this  armband in place for the full 96 hours following administration, and then you may remove the band.Information for Discharge Teaching: EXPAREL (bupivacaine liposome injectable suspension)   Your surgeon or anesthesiologist gave you EXPAREL(bupivacaine) to help control your pain after surgery.   EXPAREL is a local anesthetic that provides pain relief by numbing the tissue around the surgical site.  EXPAREL is designed to release pain medication over time and can control pain for up to 72 hours.  Depending on how you respond to EXPAREL, you may require less pain medication during your recovery.  Possible side effects:  Temporary loss of sensation or ability to move in the area where bupivacaine was injected.  Nausea, vomiting, constipation  Rarely, numbness and tingling in your mouth or lips, lightheadedness, or anxiety may occur.  Call your doctor right away if you think you may be experiencing any of these sensations, or if you have other questions regarding possible side effects.  Follow all other discharge instructions given to you by your surgeon or nurse. Eat a healthy diet and drink plenty of water or other fluids.  If you return to the hospital for any reason within 96 hours following  the administration of EXPAREL, it is important for health care providers to know that you have received this anesthetic. A teal colored band has been placed on your arm with the date, time and amount of EXPAREL you have received in order to alert and inform your health care providers. Please leave this armband in place for the full 96 hours following administration, and then you may remove the band.

## 2018-08-31 NOTE — Progress Notes (Signed)
Assisted Dr. Singer with left, ultrasound guided, interscalene  block. Side rails up, monitors on throughout procedure. See vital signs in flow sheet. Tolerated Procedure well.  

## 2018-08-31 NOTE — Op Note (Signed)
08/31/2018  1:01 PM  PATIENT:  Alexandra Mcguire    PRE-OPERATIVE DIAGNOSIS:   1.  Left shoulder full-thickness supraspinatus tear 2.  Left shoulder impingement syndrome   POST-OPERATIVE DIAGNOSIS:    1.  Left shoulder full-thickness supraspinatus tear 2.  Left shoulder impingement syndrome 3.  Left shoulder subscapularis tear  PROCEDURE: Left shoulder arthroscopy, with extensive debridement, acromioplasty, with supraspinatus repair and repair of subscapularis  SURGEON:  Johnny Bridge, MD  PHYSICIAN ASSISTANT: Joya Gaskins, OPA-C, present and scrubbed throughout the case, critical for completion in a timely fashion, and for retraction, instrumentation, and closure.  ANESTHESIA:   General with regional block using Exparel  PREOPERATIVE INDICATIONS:  Alexandra Mcguire is a  48 y.o. female with significant shoulder pain who failed conservative measures and elected for surgical management.    The risks benefits and alternatives were discussed with the patient preoperatively including but not limited to the risks of infection, bleeding, nerve injury, cardiopulmonary complications, the need for revision surgery, among others, and the patient was willing to proceed.  ESTIMATED BLOOD LOSS: Minimal  COMPLICATIONS: broken tip of scorpion needle within the thickened supraspinatus tendon, unable to be retrieved.    OPERATIVE IMPLANTS: Arthrex Biocomposite SwiveLock 4.75 x 3, 1 for the subscapularis, and 2 for the medial row preloaded with fibertape, and 2 lateral Biocomposite SwiveLock 4.75 mm anchors for the lateral row in a SpeedBridge configuration.  OPERATIVE FINDINGS: The shoulder had full motion during examination under anesthesia.  She was also stable.  There was a mild amount of articular cartilage damage at the bare spot, and also at the inferior aspect of the glenoid with some chondral delamination.  The biceps pulley was intact, although it was a little bit suspect, and there was  definitely an upper border subscapularis tear.  Infraspinatus was intact.  The supraspinatus had a fairly significant full-thickness tear measuring 2 x 2 cm.  Tendon quality was extremely thick,.  There was significant CA ligament fraying.  The mobility was good.  Bone quality was excellent, we almost needed to use a tap.  I was able to achieve an excellent repair to bone.  OPERATIVE PROCEDURE: The patient was brought to the operating room and placed in the supine position.  General anesthesia was administered and the patient positioned in a beach chair position.  IV antibiotics were given.  The upper extremity was prepped and draped in the usual sterile fashion.  Timeout performed.  Diagnostic arthroscopy was carried out with the above named findings.    I used the arthroscopic shaver to debride portions of the glenoid face, and also the undersurface of the supraspinatus, and the upper border the subscapularis with fraying.  There was a little fraying of the anterior labrum which was debrided as well.  Superior labrum was intact.  The biceps tendon was essentially pristine.  I debrided the undersurface of the cuff as well as prepared the medial edge of the tuberosity for reimplantation using the shaver.  Using a scorpion suture passer through an anterior cannula I placed a simple fiber tape through the subscapularis and then advanced that into the head of the humerus.  I prepared the lesser tuberosity with a bur as well.  I used a 4.75 mm swivel lock for the fixation.  I went to the subacromial space, performed a complete bursectomy, subacromial CA ligament release, with a acromioplasty.  I evaluated the tear from viewing laterally, used the shaver from posterior laterally, debrided the tear as well  as the bony footprint, and prepared the tendon for reinsertion.  I placed 2 anchors from above using an anterior and posterior portal with percutaneous placement for the medial row preloaded with fiber tape  in each anchor.  I passed the sutures using a scorpion suture passer from front to back, and then brought these into lateral anchors.  During 1 of the suture passages, the tip of the scorpion needle bound within the tendon and broke, I evaluated the location of the broken tip both from above as well as below the tendon, and was not able to find or retrieve the tip.    I proceeded with the remainder of the fixation.  The sutures were brought into the lateral ankles, and had excellent apposition of the tendon to bone.    Excellent fixation and reduction of the tendon was achieved.  I touched up the acromioplasty viewing from lateral portal.  The instruments were removed, the portals closed with Monocryl followed by Steri-Strips and sterile gauze.  The patient was awakened and returned to the PACU in stable and satisfactory condition.  There were no complications with the exception of the broken needle tip and She tolerated the procedure well.

## 2018-08-31 NOTE — Anesthesia Preprocedure Evaluation (Signed)
Anesthesia Evaluation  Patient identified by MRN, date of birth, ID band Patient awake    Reviewed: Allergy & Precautions, NPO status , Patient's Chart, lab work & pertinent test results  History of Anesthesia Complications (+) PONV and history of anesthetic complications  Airway Mallampati: II  TM Distance: >3 FB Neck ROM: Full    Dental no notable dental hx. (+) Dental Advisory Given   Pulmonary neg pulmonary ROS,    Pulmonary exam normal        Cardiovascular negative cardio ROS Normal cardiovascular exam     Neuro/Psych negative neurological ROS  negative psych ROS   GI/Hepatic negative GI ROS, Neg liver ROS,   Endo/Other  negative endocrine ROS  Renal/GU negative Renal ROS  negative genitourinary   Musculoskeletal negative musculoskeletal ROS (+)   Abdominal   Peds negative pediatric ROS (+)  Hematology negative hematology ROS (+)   Anesthesia Other Findings   Reproductive/Obstetrics negative OB ROS                             Anesthesia Physical Anesthesia Plan  ASA: I  Anesthesia Plan: General   Post-op Pain Management:  Regional for Post-op pain   Induction: Intravenous  PONV Risk Score and Plan: 4 or greater and Ondansetron, Dexamethasone, Scopolamine patch - Pre-op and Diphenhydramine  Airway Management Planned: Oral ETT  Additional Equipment:   Intra-op Plan:   Post-operative Plan: Extubation in OR  Informed Consent: I have reviewed the patients History and Physical, chart, labs and discussed the procedure including the risks, benefits and alternatives for the proposed anesthesia with the patient or authorized representative who has indicated his/her understanding and acceptance.   Dental advisory given  Plan Discussed with: CRNA, Anesthesiologist and Surgeon  Anesthesia Plan Comments:         Anesthesia Quick Evaluation

## 2018-08-31 NOTE — Anesthesia Procedure Notes (Signed)
Procedure Name: Intubation Performed by: Jaylei Fuerte W, CRNA Pre-anesthesia Checklist: Patient identified, Emergency Drugs available, Suction available and Patient being monitored Patient Re-evaluated:Patient Re-evaluated prior to induction Oxygen Delivery Method: Circle system utilized Preoxygenation: Pre-oxygenation with 100% oxygen Induction Type: IV induction Ventilation: Mask ventilation without difficulty Laryngoscope Size: Miller and 2 Grade View: Grade I Tube type: Oral Tube size: 7.0 mm Number of attempts: 1 Airway Equipment and Method: Stylet Placement Confirmation: ETT inserted through vocal cords under direct vision,  positive ETCO2 and breath sounds checked- equal and bilateral Secured at: 22 cm Tube secured with: Tape Dental Injury: Teeth and Oropharynx as per pre-operative assessment        

## 2018-08-31 NOTE — H&P (Signed)
PREOPERATIVE H&P  Chief Complaint: Left shoulder pain  HPI: Alexandra Mcguire is a 48 y.o. female who presents for preoperative history and physical with a diagnosis of left shoulder rotator cuff tear. Symptoms are rated as moderate to severe, and have been worsening.  This is significantly impairing activities of daily living.  She has elected for surgical management.   She has failed injections, activity modification, anti-inflammatories, and exercises.   Past Medical History:  Diagnosis Date  . Allergy   . Ankle syndesmosis disruption 02/22/2012  . Complication of anesthesia   . Endometriosis   . Fibroadenoma of breast, left   . Miscarriage 2009   2009  . Ovarian cyst   . PONV (postoperative nausea and vomiting)    Past Surgical History:  Procedure Laterality Date  . BREAST LUMPECTOMY Left 1989, 1991   fibroadenoma  . CESAREAN SECTION  2001  . EXPLORATORY LAPAROTOMY  1994   Endometriosis  . OOPHORECTOMY Left 1996  . ORIF ANKLE FRACTURE  02/21/2012   Procedure: OPEN REDUCTION INTERNAL FIXATION (ORIF) ANKLE FRACTURE;  Surgeon: Johnny Bridge, MD;  Location: [facility];  Service: Orthopedics;  Laterality: Right;  . SHOULDER ARTHROSCOPY Right 2012   Social History   Socioeconomic History  . Marital status: Married    Spouse name: Not on file  . Number of children: Not on file  . Years of education: Not on file  . Highest education level: Not on file  Occupational History  . Occupation: Physiological scientist  . Occupation: Designer, jewellery  Social Needs  . Financial resource strain: Not on file  . Food insecurity:    Worry: Not on file    Inability: Not on file  . Transportation needs:    Medical: Not on file    Non-medical: Not on file  Tobacco Use  . Smoking status: Never Smoker  . Smokeless tobacco: Never Used  Substance and Sexual Activity  . Alcohol use: Yes    Comment: occasional  . Drug use: No  . Sexual activity: Yes    Partners: Male    Birth  control/protection: Pill    Comment: Married  Lifestyle  . Physical activity:    Days per week: Not on file    Minutes per session: Not on file  . Stress: Not on file  Relationships  . Social connections:    Talks on phone: Not on file    Gets together: Not on file    Attends religious service: Not on file    Active member of club or organization: Not on file    Attends meetings of clubs or organizations: Not on file    Relationship status: Not on file  Other Topics Concern  . Not on file  Social History Narrative   Stanton Kidney to [place]. 2 children named Wells Guiles and [place].   Masters degree, employed as a Glass blower/designer.   Drinks caffeine, takes herbal remedies, takes a daily vitamin.   Wears her seatbelt, wears a bicycle helmet, smoke detector in the home.   Exercises routinely.   Feels safe in her  relationships.   Family History  Problem Relation Age of Onset  . Heart disease Mother   . Arthritis Mother   . Hypertension Mother   . Fibroids Mother   . Mental illness Father   . Heart disease Father   . Osteosarcoma Father        31  . Hypertension Father   . Arthritis Maternal Grandmother   .  Heart disease Maternal Grandmother   . Heart disease Maternal Grandfather   . Early death Maternal Grandfather   . Heart disease Paternal Grandmother   . Heart disease Paternal Grandfather   . Early death Paternal Grandfather    Allergies  Allergen Reactions  . Sulfa Antibiotics Rash   Prior to Admission medications   Medication Sig Start Date End Date Taking? Authorizing Provider  Cholecalciferol (VITAMIN D-3) 5000 units TABS Take 2,500 Units by mouth daily.   Yes [provider]  fish oil-omega-3 fatty acids 1000 MG capsule Take 1 g by mouth daily.   Yes [provider]  MAGNESIUM GLYCINATE PLUS PO Take 1 capsule by mouth daily.   Yes [provider]  Multiple Vitamin (MULTIVITAMIN WITH MINERALS) TABS Take 1 tablet by mouth  daily.   Yes [provider]  TURMERIC PO Take 1,300 mg by mouth.    Yes [provider]     Positive ROS: All other systems have been reviewed and were otherwise negative with the exception of those mentioned in the HPI and as above.  Physical Exam: General: Alert, no acute distress Cardiovascular: No pedal edema Respiratory: No cyanosis, no use of accessory musculature GI: No organomegaly, abdomen is soft and non-tender Skin: No lesions in the area of chief complaint Neurologic: Sensation intact distally Psychiatric: Patient is competent for consent with normal mood and affect Lymphatic: No axillary or cervical lymphadenopathy  MUSCULOSKELETAL: Left shoulder is not pain over the acromioclavicular joint, but she does have impingement signs and a positive drop arm test.  Nearly full motion.  Assessment: Left shoulder full-thickness rotator cuff tear with impingement syndrome   Plan: Plan for Procedure(s): SHOULDER ARTHROSCOPY WITH ROTATOR CUFF REPAIR AND SUBACROMIAL DECOMPRESSION  The risks benefits and alternatives were discussed with the patient including but not limited to the risks of nonoperative treatment, versus surgical intervention including infection, bleeding, nerve injury,  blood clots, cardiopulmonary complications, recurrent rotator cuff tear, stiffness, loss of function, inability to return to weightlifting, among others, and they were willing to proceed.     Johnny Bridge, MD Cell [phone]   08/31/2018 12:51 PM

## 2018-08-31 NOTE — Transfer of Care (Signed)
Immediate Anesthesia Transfer of Care Note  Patient: Alexandra Mcguire  Procedure(s) Performed: SHOULDER ARTHROSCOPY WITH ROTATOR CUFF REPAIR AND SUBACROMIAL DECOMPRESSION (Left )  Patient Location: PACU  Anesthesia Type:General and Regional  Level of Consciousness: awake and sedated  Airway & Oxygen Therapy: Patient Spontanous Breathing and Patient connected to face mask oxygen  Post-op Assessment: Report given to RN and Post -op Vital signs reviewed and stable  Post vital signs: Reviewed and stable  Last Vitals:  Vitals Value Taken Time  BP 108/71 08/31/2018  3:15 PM  Temp 36.5 C 08/31/2018  3:14 PM  Pulse 65 08/31/2018  3:15 PM  Resp 14 08/31/2018  3:15 PM  SpO2 100 % 08/31/2018  3:15 PM  Vitals shown include unvalidated device data.  Last Pain:  Vitals:   08/31/18 1056  TempSrc: Oral  PainSc: 4       Patients Stated Pain Goal: 3 (26/37/85 8850)  Complications: No apparent anesthesia complications

## 2018-09-01 ENCOUNTER — Encounter (HOSPITAL_BASED_OUTPATIENT_CLINIC_OR_DEPARTMENT_OTHER): Payer: Self-pay | Admitting: Orthopedic Surgery

## 2018-09-01 NOTE — Anesthesia Postprocedure Evaluation (Signed)
Anesthesia Post Note  Patient: Alexandra Mcguire  Procedure(s) Performed: SHOULDER ARTHROSCOPY WITH ROTATOR CUFF REPAIR AND SUBACROMIAL DECOMPRESSION (Left Shoulder)     Patient location during evaluation: PACU Anesthesia Type: General Level of consciousness: sedated Pain management: pain level controlled Vital Signs Assessment: post-procedure vital signs reviewed and stable Respiratory status: spontaneous breathing and respiratory function stable Cardiovascular status: stable Postop Assessment: no apparent nausea or vomiting Anesthetic complications: no    Last Vitals:  Vitals:   08/31/18 1600 08/31/18 1630  BP: 117/70 116/86  Pulse: (!) 59 60  Resp: 18 18  Temp:  36.4 C  SpO2: 99% 100%    Last Pain:  Vitals:   08/31/18 1630  TempSrc:   PainSc: 0-No pain                 Bannie Lobban DANIEL

## 2018-09-06 MED FILL — traMADol HCL 50 MG TABS: 50 | 10 days supply | Qty: 30 | Fill #0

## 2018-09-20 DIAGNOSIS — M7542 Impingement syndrome of left shoulder: Secondary | ICD-10-CM | POA: Diagnosis not present

## 2018-09-20 DIAGNOSIS — M25512 Pain in left shoulder: Secondary | ICD-10-CM | POA: Diagnosis not present

## 2018-10-11 DIAGNOSIS — S46012D Strain of muscle(s) and tendon(s) of the rotator cuff of left shoulder, subsequent encounter: Secondary | ICD-10-CM | POA: Diagnosis not present

## 2018-10-12 ENCOUNTER — Ambulatory Visit: Payer: 59 | Attending: Orthopedic Surgery | Admitting: Physical Therapy

## 2018-10-12 ENCOUNTER — Other Ambulatory Visit: Payer: Self-pay

## 2018-10-12 ENCOUNTER — Encounter: Payer: Self-pay | Admitting: Physical Therapy

## 2018-10-12 DIAGNOSIS — M25512 Pain in left shoulder: Secondary | ICD-10-CM | POA: Diagnosis not present

## 2018-10-12 DIAGNOSIS — M6281 Muscle weakness (generalized): Secondary | ICD-10-CM | POA: Insufficient documentation

## 2018-10-12 DIAGNOSIS — M25612 Stiffness of left shoulder, not elsewhere classified: Secondary | ICD-10-CM | POA: Insufficient documentation

## 2018-10-12 DIAGNOSIS — G8929 Other chronic pain: Secondary | ICD-10-CM | POA: Diagnosis not present

## 2018-10-12 NOTE — Patient Instructions (Signed)
Access Code: [email or web address]  URL: [email or web address]  Date: 10/12/2018  Prepared by: Lovett Calender   Exercises  Standing Isometric Shoulder External Rotation with Doorway and Towel Roll - 10 reps - 1 sets - 5 sec hold - 2x daily - 7x weekly  Standing Isometric Shoulder Internal Rotation with Towel Roll at Doorway - 10 reps - 1 sets - 2x daily - 7x weekly  Standing Isometric Shoulder Abduction with Doorway - Arm Bent - 10 reps - 1 sets - 2x daily - 7x weekly  Standing Isometric Shoulder Flexion with Doorway - Arm Bent - 10 reps - 1 sets - 2x daily - 7x weekly  Seated Shoulder Flexion Towel Slide at Table Top - 5 reps - 1 sets - 10 sec hold - 2x daily - 7x weekly  Seated Shoulder External Rotation PROM on Table - 4 reps - 1 sets - 10 sec hold - 2x daily - 7x weekly

## 2018-10-12 NOTE — Therapy (Signed)
[facility] [address] [place], [place], [address] Phone: [phone]   Fax:  [phone]  Physical Therapy Evaluation  Patient Details  Name: Alexandra Mcguire MRN: [id number] Date of Birth: Jul 11, 1971 Referring Provider (PT): Marchia Bond, MD   Encounter Date: 10/12/2018  PT End of Session - 10/12/18 1602    Visit Number  1    Date for PT Re-Evaluation  01/04/19    PT Start Time  0845    PT Stop Time  0925    PT Time Calculation (min)  40 min    Activity Tolerance  Patient tolerated treatment well;Patient limited by pain    Behavior During Therapy  --       Past Medical History:  Diagnosis Date  . Allergy   . Ankle syndesmosis disruption 02/22/2012  . Complication of anesthesia   . Endometriosis   . Fibroadenoma of breast, left   . Miscarriage 2009   2009  . Nontraumatic tear of supraspinatus tendon, left 08/31/2018  . Ovarian cyst   . PONV (postoperative nausea and vomiting)     Past Surgical History:  Procedure Laterality Date  . BREAST LUMPECTOMY Left 1989, 1991   fibroadenoma  . CESAREAN SECTION  2001  . EXPLORATORY LAPAROTOMY  1994   Endometriosis  . OOPHORECTOMY Left 1996  . ORIF ANKLE FRACTURE  02/21/2012   Procedure: OPEN REDUCTION INTERNAL FIXATION (ORIF) ANKLE FRACTURE;  Surgeon: Johnny Bridge, MD;  Location: [place];  Service: Orthopedics;  Laterality: Right;  . SHOULDER ARTHROSCOPY Right 2012  . SHOULDER ARTHROSCOPY WITH ROTATOR CUFF REPAIR AND SUBACROMIAL DECOMPRESSION Left 08/31/2018   Procedure: SHOULDER ARTHROSCOPY WITH ROTATOR CUFF REPAIR AND SUBACROMIAL DECOMPRESSION;  Surgeon: Marchia Bond, MD;  Location: [place];  Service: Orthopedics;  Laterality: Left;    There were no vitals filed for this visit.   Subjective Assessment - 10/12/18 1059    Subjective  Pt here s/p RTC repair with decompression Lt UE.  Just stopped wearing the sling this weekend.  Feeling much  better without the sling and able to move a little more.    Pertinent History  RTC surgery 08/31/18    Limitations  House hold activities;Lifting    Patient Stated Goals  be able to regain strength and mobility    Currently in Pain?  No/denies         [facility] PT Assessment - 10/12/18 0001      Assessment   Medical Diagnosis  Z98.890 (ICD-10-CM) - S/P left rotator cuff repair    Referring Provider (PT)  Marchia Bond, MD    Onset Date/Surgical Date  08/31/18    Prior Therapy  No      Restrictions   Weight Bearing Restrictions  No      Balance Screen   Has the patient fallen in the past 6 months  No      [place] residence    Living Arrangements  Spouse/significant other;Children   1 daughter home, 1 college     Prior Function   Level of Independence  Independent    Vocation  Full time employment    Vocation Requirements  personal training and teach      Cognition   Overall Cognitive Status  Within Functional Limits for tasks assessed      Observation/Other Assessments   Focus on Therapeutic Outcomes (FOTO)   57% limited      Posture/Postural Control  Posture/Postural Control  Postural limitations    Postural Limitations  Rounded Shoulders      ROM / Strength   AROM / PROM / Strength  AROM;PROM;Strength      AROM   Overall AROM Comments  --    AROM Assessment Site  Shoulder    Right/Left Shoulder  Left    Left Shoulder Flexion  30 Degrees    Left Shoulder ABduction  5 Degrees      PROM   PROM Assessment Site  Shoulder    Right/Left Shoulder  Left    Left Shoulder Flexion  90 Degrees    Left Shoulder ABduction  90 Degrees    Left Shoulder Internal Rotation  45 Degrees    Left Shoulder External Rotation  38 Degrees      Strength   Overall Strength Comments  abd and ER    Strength Assessment Site  Shoulder    Right/Left Shoulder  Left    Left Shoulder Flexion  2-/5    Left Shoulder Extension  2/5    Left Shoulder  ABduction  2/5    Left Shoulder Internal Rotation  2+/5    Left Shoulder External Rotation  2-/5      Palpation   Palpation comment  tight end feel with PROM                Objective measurements completed on examination: See above findings.      [place] Adult PT Treatment/Exercise - 10/12/18 0001      Self-Care   Self-Care  Other Self-Care Comments    Other Self-Care Comments   educated and performed intial HEP             PT Education - 10/12/18 1634    Education Details   Access Code: [email or web address]     Person(s) Educated  Patient    Methods  Explanation;Demonstration;Verbal cues;Handout    Comprehension  Verbalized understanding;Returned demonstration       PT Short Term Goals - 10/12/18 1638      PT SHORT TERM GOAL #1   Title  Pt will demonstrate90 degrees AROM Lt shoulder abduction and flexion for improved functional reaching    Time  6    Period  Weeks    Status  New    Target Date  11/23/18      PT SHORT TERM GOAL #2   Title  Pt will be able to place small weight such as a glass on shelf at shoulder height without increased pain due to improved shoulder strength    Time  6    Period  Weeks    Status  New    Target Date  11/23/18      PT SHORT TERM GOAL #3   Title  Pt will be ind with initial HEP    Time  6    Period  Weeks    Status  New    Target Date  11/23/18      PT SHORT TERM GOAL #4   Title  Pt will be able to reach behind head for putting hair back    Time  6    Period  Weeks    Status  New    Target Date  11/23/18        PT Long Term Goals - 10/12/18 1634      PT LONG TERM GOAL #1   Title  Pt will be able to demonstrate AROM Lt shoulder flexion of  at least 120 degrees for improved functional reaching    Time  12    Period  Weeks    Status  New    Target Date  01/04/19      PT LONG TERM GOAL #2   Title  Pt will be ind with advanced HEP    Time  12    Period  Weeks    Status  New    Target Date  01/04/19      PT LONG  TERM GOAL #3   Title  Pt will demonstrate at least 4/5 Lt shoulder flexion and abduction for lifting and reaching activities and house hold chores    Time  12    Period  Weeks    Status  New    Target Date  01/04/19      PT LONG TERM GOAL #4   Title  Pt will report < or = to 29% FOTO    Time  12    Period  Weeks    Status  New    Target Date  01/04/19      PT LONG TERM GOAL #5   Title  Pt will report she is able to get back to 80% of her normal functional activities due to improved strength and ROM    Time  12    Period  Weeks    Status  New    Target Date  01/04/19             Plan - 10/12/18 1046    Clinical Impression Statement  Pt is active 48 y/o female who 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  She has had chronic pain and RTC issues on Lt side for a couple of years.  Pt had surgery to repair RTC o 08/31/18 and is now here for follow up care to improve strength and ROM.  Pt just got out of the sling this past weekend.  She has weakness especially Lt ER and abduction.  Pt has limited ROM and decreased function of Lt UE.  Pt will benefit from skilled PT to address impairments and safely progress activity level and ROM for return to maximum function.    History and Personal Factors relevant to plan of care:  s/p Lt RTC repair subacromial decompression 08/31/18    Clinical Presentation  Stable    Clinical Presentation due to:  pt is stable    Rehab Potential  Excellent    PT Frequency  1x / week    PT Duration  12 weeks    PT Treatment/Interventions  ADLs/Self Care Home Management;Cryotherapy;Electrical Stimulation;Moist Heat;Therapeutic activities;Therapeutic exercise;Neuromuscular re-education;Patient/family education;Manual techniques;Taping;Passive range of motion;Dry needling;Vasopneumatic Device    PT Next Visit Plan  progress ROM as able AAROM and PROM; f/u on shoulder isometric exercises    PT Home Exercise Plan   Access Code:  [email or web address]     Consulted and Agree with Plan of Care  Patient       Patient will benefit from skilled therapeutic intervention in order to improve the following deficits and impairments:  Pain, Decreased strength, Increased fascial restricitons, Impaired UE functional use, Decreased range of motion  Visit Diagnosis: Stiffness of left shoulder, not elsewhere classified  Muscle weakness (generalized)  Chronic left shoulder pain     Problem List Patient Active Problem List   Diagnosis Date Noted  . Nontraumatic tear of supraspinatus tendon, left 08/31/2018  . Vitamin D deficiency 05/24/2016  .  Family history of heart disease 03/26/2016  . Overweight (BMI 25.0-29.9) 03/26/2016  . Partial nontraumatic tear of left rotator cuff 03/15/2016  . Ankle syndesmosis disruption 02/22/2012    Zannie Cove, PT 10/12/2018, 4:50 PM  [facility] [address] [place], [place], [address] Phone: [phone]   Fax:  [phone]  Name: Alexandra Mcguire MRN: [id number] Date of Birth: 1971-03-25

## 2018-10-17 ENCOUNTER — Ambulatory Visit: Payer: 59

## 2018-10-17 DIAGNOSIS — M25612 Stiffness of left shoulder, not elsewhere classified: Secondary | ICD-10-CM | POA: Diagnosis not present

## 2018-10-17 DIAGNOSIS — M25512 Pain in left shoulder: Secondary | ICD-10-CM

## 2018-10-17 DIAGNOSIS — M6281 Muscle weakness (generalized): Secondary | ICD-10-CM | POA: Diagnosis not present

## 2018-10-17 DIAGNOSIS — G8929 Other chronic pain: Secondary | ICD-10-CM

## 2018-10-17 NOTE — Patient Instructions (Signed)
Access Code: [email or web address]  URL: [email or web address]  Date: 10/17/2018  Prepared by: Sigurd Sos   Exercises  Seated Shoulder Flexion AAROM with Pulley Behind - 3x daily - 7x weekly Scaption Wall Slide with Towel - 10 reps - 1 sets - 2x daily - 7x weekly Shoulder Flexion Wall Slide with Towel - 10 reps - 1 sets - 2x daily                            - 7x weekly

## 2018-10-17 NOTE — Therapy (Signed)
[facility] [address], [place], [place], [address] Phone: [phone]   Fax:  [phone]  Physical Therapy Treatment  Patient Details  Name: Alexandra Mcguire MRN: [id number] Date of Birth: 02-12-1971 Referring Provider (PT): Marchia Bond, MD   Encounter Date: 10/17/2018  PT End of Session - 10/17/18 0925    Visit Number  2    Date for PT Re-Evaluation  01/04/19    PT Start Time  0845    PT Stop Time  0928    PT Time Calculation (min)  43 min    Activity Tolerance  Patient tolerated treatment well    Behavior During Therapy  [facility] for tasks assessed/performed       Past Medical History:  Diagnosis Date  . Allergy   . Ankle syndesmosis disruption 02/22/2012  . Complication of anesthesia   . Endometriosis   . Fibroadenoma of breast, left   . Miscarriage 2009   2009  . Nontraumatic tear of supraspinatus tendon, left 08/31/2018  . Ovarian cyst   . PONV (postoperative nausea and vomiting)     Past Surgical History:  Procedure Laterality Date  . BREAST LUMPECTOMY Left 1989, 1991   fibroadenoma  . CESAREAN SECTION  2001  . EXPLORATORY LAPAROTOMY  1994   Endometriosis  . OOPHORECTOMY Left 1996  . ORIF ANKLE FRACTURE  02/21/2012   Procedure: OPEN REDUCTION INTERNAL FIXATION (ORIF) ANKLE FRACTURE;  Surgeon: Johnny Bridge, MD;  Location: [facility];  Service: Orthopedics;  Laterality: Right;  . SHOULDER ARTHROSCOPY Right 2012  . SHOULDER ARTHROSCOPY WITH ROTATOR CUFF REPAIR AND SUBACROMIAL DECOMPRESSION Left 08/31/2018   Procedure: SHOULDER ARTHROSCOPY WITH ROTATOR CUFF REPAIR AND SUBACROMIAL DECOMPRESSION;  Surgeon: Marchia Bond, MD;  Location: [facility] [place];  Service: Orthopedics;  Laterality: Left;    There were no vitals filed for this visit.  Subjective Assessment - 10/17/18 0848    Subjective  I am doing well with the exercises that I got last time.      Pertinent History  RTC surgery 08/31/18    Patient Stated Goals  be able to regain strength and mobility    Currently in Pain?  No/denies                       [facility] Adult PT Treatment/Exercise - 10/17/18 0001      Exercises   Exercises  Shoulder      Shoulder Exercises: Prone   Other Prone Exercises  table slides into flexion and ER x 10 each      Shoulder Exercises: Standing   Flexion  AAROM;Left;10 reps    Flexion Limitations  using wall slides    Diagonals  AAROM;Both;10 reps    Diagonals Limitations  using wall       Shoulder Exercises: Pulleys   Flexion  3 minutes      Shoulder Exercises: Isometric Strengthening   Flexion  5X5"    Flexion Limitations  2 sets    Extension  5X5"    Extension Limitations  2 sets     External Rotation  5X5"    External Rotation Limitations  2 sets    Internal Rotation  5X5"    Internal Rotation Limitations  2 sets      Manual Therapy   Manual Therapy  Passive ROM;Joint mobilization    Manual therapy comments  P/ROM Lt shoulder into flexion, abdution IR/ER to tolerance    Joint Mobilization  inferior glides of glenohumeral joint to tolerance             PT Education - 10/17/18 0924    Education Details  Access Code: [email or web address]    Person(s) Educated  Patient    Methods  Explanation;Demonstration;Handout    Comprehension  Verbalized understanding;Returned demonstration       PT Short Term Goals - 10/12/18 1638      PT SHORT TERM GOAL #1   Title  Pt will demonstrate90 degrees AROM Lt shoulder abduction and flexion for improved functional reaching    Time  6    Period  Weeks    Status  New    Target Date  11/23/18      PT SHORT TERM GOAL #2   Title  Pt will be able to place small weight such as a glass on shelf at shoulder height without increased pain due to improved shoulder strength    Time  6    Period  Weeks    Status  New    Target Date  11/23/18      PT SHORT TERM GOAL #3   Title  Pt will be ind with initial HEP    Time  6    Period   Weeks    Status  New    Target Date  11/23/18      PT SHORT TERM GOAL #4   Title  Pt will be able to reach behind head for putting hair back    Time  6    Period  Weeks    Status  New    Target Date  11/23/18        PT Long Term Goals - 10/12/18 1634      PT LONG TERM GOAL #1   Title  Pt will be able to demonstrate AROM Lt shoulder flexion of at least 120 degrees for improved functional reaching    Time  12    Period  Weeks    Status  New    Target Date  01/04/19      PT LONG TERM GOAL #2   Title  Pt will be ind with advanced HEP    Time  12    Period  Weeks    Status  New    Target Date  01/04/19      PT LONG TERM GOAL #3   Title  Pt will demonstrate at least 4/5 Lt shoulder flexion and abduction for lifting and reaching activities and house hold chores    Time  12    Period  Weeks    Status  New    Target Date  01/04/19      PT LONG TERM GOAL #4   Title  Pt will report < or = to 29% FOTO    Time  12    Period  Weeks    Status  New    Target Date  01/04/19      PT LONG TERM GOAL #5   Title  Pt will report she is able to get back to 80% of her normal functional activities due to improved strength and ROM    Time  12    Period  Weeks    Status  New    Target Date  01/04/19            Plan - 10/17/18 0857    Clinical Impression Statement  Pt with first time follow up after evaluation s/p RTC  repair 08/31/18.  Pt is independent in current HEP for isometrics and A/AROM.  Pt performs exercise without scapular elevation.  Pt with reduced Lt shoulder P/ROM and tolerated P/ROM and mobs well today. Pt will continue to benefit from skilled PT to advance exercise for strength and A/ROM s/p RTC repair.      PT Frequency  1x / week    PT Duration  12 weeks    PT Treatment/Interventions  ADLs/Self Care Home Management;Cryotherapy;Electrical Stimulation;Moist Heat;Therapeutic activities;Therapeutic exercise;Neuromuscular re-education;Patient/family education;Manual  techniques;Taping;Passive range of motion;Dry needling;Vasopneumatic Device    PT Next Visit Plan  continue to advance exercise and strength per post-op protocol. UE ranger, supine AROM    PT Home Exercise Plan   Access Code: [email or web address]  initial certification is signed    Consulted and Agree with Plan of Care  Patient       Patient will benefit from skilled therapeutic intervention in order to improve the following deficits and impairments:  Pain, Decreased strength, Increased fascial restricitons, Impaired UE functional use, Decreased range of motion  Visit Diagnosis: Stiffness of left shoulder, not elsewhere classified  Muscle weakness (generalized)  Chronic left shoulder pain     Problem List Patient Active Problem List   Diagnosis Date Noted  . Nontraumatic tear of supraspinatus tendon, left 08/31/2018  . Vitamin D deficiency 05/24/2016  . Family history of heart disease 03/26/2016  . Overweight (BMI 25.0-29.9) 03/26/2016  . Partial nontraumatic tear of left rotator cuff 03/15/2016  . Ankle syndesmosis disruption 02/22/2012    Sigurd Sos, PT 10/17/18 9:30 AM  [facility] [address], [place], [place], [address] Phone: [phone]   Fax:  [phone]  Name: Alexandra Mcguire MRN: [id number] Date of Birth: 1970-10-19

## 2018-10-24 ENCOUNTER — Ambulatory Visit: Payer: 59 | Attending: Orthopedic Surgery

## 2018-10-24 DIAGNOSIS — M25512 Pain in left shoulder: Secondary | ICD-10-CM | POA: Diagnosis not present

## 2018-10-24 DIAGNOSIS — M6281 Muscle weakness (generalized): Secondary | ICD-10-CM | POA: Insufficient documentation

## 2018-10-24 DIAGNOSIS — M25612 Stiffness of left shoulder, not elsewhere classified: Secondary | ICD-10-CM | POA: Insufficient documentation

## 2018-10-24 DIAGNOSIS — G8929 Other chronic pain: Secondary | ICD-10-CM | POA: Diagnosis not present

## 2018-10-24 NOTE — Therapy (Signed)
[facility] [address] [place], [place], [address] Phone: [phone]   Fax:  [phone]  Physical Therapy Treatment  Patient Details  Name: Alexandra Mcguire MRN: [id number] Date of Birth: 12-29-1970 Referring Provider (PT): Marchia Bond, MD   Encounter Date: 10/24/2018  PT End of Session - 10/24/18 0843    Visit Number  3    Date for PT Re-Evaluation  01/04/19    PT Start Time  0800    PT Stop Time  0842    PT Time Calculation (min)  42 min    Activity Tolerance  Patient tolerated treatment well    Behavior During Therapy  [facility] for tasks assessed/performed       Past Medical History:  Diagnosis Date  . Allergy   . Ankle syndesmosis disruption 02/22/2012  . Complication of anesthesia   . Endometriosis   . Fibroadenoma of breast, left   . Miscarriage 2009   2009  . Nontraumatic tear of supraspinatus tendon, left 08/31/2018  . Ovarian cyst   . PONV (postoperative nausea and vomiting)     Past Surgical History:  Procedure Laterality Date  . BREAST LUMPECTOMY Left 1989, 1991   fibroadenoma  . CESAREAN SECTION  2001  . EXPLORATORY LAPAROTOMY  1994   Endometriosis  . OOPHORECTOMY Left 1996  . ORIF ANKLE FRACTURE  02/21/2012   Procedure: OPEN REDUCTION INTERNAL FIXATION (ORIF) ANKLE FRACTURE;  Surgeon: Johnny Bridge, MD;  Location: [facility];  Service: Orthopedics;  Laterality: Right;  . SHOULDER ARTHROSCOPY Right 2012  . SHOULDER ARTHROSCOPY WITH ROTATOR CUFF REPAIR AND SUBACROMIAL DECOMPRESSION Left 08/31/2018   Procedure: SHOULDER ARTHROSCOPY WITH ROTATOR CUFF REPAIR AND SUBACROMIAL DECOMPRESSION;  Surgeon: Marchia Bond, MD;  Location: [facility];  Service: Orthopedics;  Laterality: Left;    There were no vitals filed for this visit.  Subjective Assessment - 10/24/18 0806    Subjective  I got some pulleys for home.  My shoulder is sore this morning and I think I slept wrong.      Pertinent  History  RTC surgery 08/31/18    Currently in Pain?  Yes    Pain Score  1     Pain Location  Shoulder    Pain Orientation  Left    Pain Descriptors / Indicators  Aching    Pain Frequency  Intermittent    Aggravating Factors   sleeping on it wrong    Pain Relieving Factors  moving it                       [facility] Adult PT Treatment/Exercise - 10/24/18 0001      Shoulder Exercises: Prone   Other Prone Exercises  table slides into flexion and ER x 10 each      Shoulder Exercises: Standing   Flexion  AAROM;Left;10 reps    Flexion Limitations  using finger ladder with tactile cues for scapular depression    Diagonals  AAROM;Both;10 reps    Diagonals Limitations  using wall     Other Standing Exercises  shoulder flexion and extension with yellow band 2x10       Shoulder Exercises: Pulleys   Flexion  3 minutes    ABduction  2 minutes    Other Pulley Exercises  IR x 2 minutes      Shoulder Exercises: ROM/Strengthening   UBE (Upper Arm Bike)  Level 0 x 6 minutes (3/3)    Ranger  flexion x10, scaption x10      Manual Therapy   Manual Therapy  Passive ROM;Joint mobilization    Manual therapy comments  P/ROM Lt shoulder into flexion, abdution IR/ER to tolerance    Joint Mobilization  inferior glides of glenohumeral joint to tolerance               PT Short Term Goals - 10/12/18 1638      PT SHORT TERM GOAL #1   Title  Pt will demonstrate90 degrees AROM Lt shoulder abduction and flexion for improved functional reaching    Time  6    Period  Weeks    Status  New    Target Date  11/23/18      PT SHORT TERM GOAL #2   Title  Pt will be able to place small weight such as a glass on shelf at shoulder height without increased pain due to improved shoulder strength    Time  6    Period  Weeks    Status  New    Target Date  11/23/18      PT SHORT TERM GOAL #3   Title  Pt will be ind with initial HEP    Time  6    Period  Weeks    Status  New    Target Date   11/23/18      PT SHORT TERM GOAL #4   Title  Pt will be able to reach behind head for putting hair back    Time  6    Period  Weeks    Status  New    Target Date  11/23/18        PT Long Term Goals - 10/12/18 1634      PT LONG TERM GOAL #1   Title  Pt will be able to demonstrate AROM Lt shoulder flexion of at least 120 degrees for improved functional reaching    Time  12    Period  Weeks    Status  New    Target Date  01/04/19      PT LONG TERM GOAL #2   Title  Pt will be ind with advanced HEP    Time  12    Period  Weeks    Status  New    Target Date  01/04/19      PT LONG TERM GOAL #3   Title  Pt will demonstrate at least 4/5 Lt shoulder flexion and abduction for lifting and reaching activities and house hold chores    Time  12    Period  Weeks    Status  New    Target Date  01/04/19      PT LONG TERM GOAL #4   Title  Pt will report < or = to 29% FOTO    Time  12    Period  Weeks    Status  New    Target Date  01/04/19      PT LONG TERM GOAL #5   Title  Pt will report she is able to get back to 80% of her normal functional activities due to improved strength and ROM    Time  12    Period  Weeks    Status  New    Target Date  01/04/19            Plan - 10/24/18 0844    Clinical Impression Statement  Pt is making steady progress after RTC repair.  Pt is using pulleys for flexibility at home.  Pt progressed to theraband for Lt shoulder flexion and extension.  Pt required tactile cues for scapular position with abduction today.  Pt has good control of scapular with forward flexion within available strength range.  Pt will continue to benefit from progression of Lt shoulder flexibility and strength safely.      PT Frequency  1x / week    PT Duration  12 weeks    PT Treatment/Interventions  ADLs/Self Care Home Management;Cryotherapy;Electrical Stimulation;Moist Heat;Therapeutic activities;Therapeutic exercise;Neuromuscular re-education;Patient/family  education;Manual techniques;Taping;Passive range of motion;Dry needling;Vasopneumatic Device    PT Next Visit Plan  continue to advance exercise and strength per post-op protocol. UE ranger, supine AROM.  Add IR/ER with band    PT Home Exercise Plan   Access Code: [email or web address]     Consulted and Agree with Plan of Care  Patient       Patient will benefit from skilled therapeutic intervention in order to improve the following deficits and impairments:  Pain, Decreased strength, Increased fascial restricitons, Impaired UE functional use, Decreased range of motion  Visit Diagnosis: Stiffness of left shoulder, not elsewhere classified  Muscle weakness (generalized)  Chronic left shoulder pain     Problem List Patient Active Problem List   Diagnosis Date Noted  . Nontraumatic tear of supraspinatus tendon, left 08/31/2018  . Vitamin D deficiency 05/24/2016  . Family history of heart disease 03/26/2016  . Overweight (BMI 25.0-29.9) 03/26/2016  . Partial nontraumatic tear of left rotator cuff 03/15/2016  . Ankle syndesmosis disruption 02/22/2012     Sigurd Sos, PT 10/24/18 8:45 AM  [facility] [address], [place], [place], [address] Phone: [phone]   Fax:  [phone]  Name: Alexandra Mcguire MRN: [id number] Date of Birth: 27-Jan-1971

## 2018-11-02 ENCOUNTER — Encounter: Payer: 59 | Admitting: Physical Therapy

## 2018-11-07 ENCOUNTER — Ambulatory Visit: Payer: 59

## 2018-11-07 ENCOUNTER — Other Ambulatory Visit: Payer: Self-pay

## 2018-11-07 DIAGNOSIS — M6281 Muscle weakness (generalized): Secondary | ICD-10-CM | POA: Diagnosis not present

## 2018-11-07 DIAGNOSIS — M25512 Pain in left shoulder: Secondary | ICD-10-CM | POA: Diagnosis not present

## 2018-11-07 DIAGNOSIS — G8929 Other chronic pain: Secondary | ICD-10-CM

## 2018-11-07 DIAGNOSIS — M25612 Stiffness of left shoulder, not elsewhere classified: Secondary | ICD-10-CM | POA: Diagnosis not present

## 2018-11-07 NOTE — Patient Instructions (Signed)
Access Code: [email or web address]  URL: [email or web address]  Date: 11/07/2018  Prepared by: Sigurd Sos   Seated Shoulder Abduction - Palms Down - 10 reps - 2 sets - 2x daily - 7x weekly Seated Shoulder Flexion - 10 reps - 2 sets - 2x daily - 7x weekly Seated Shoulder Scaption - 10 reps - 2 sets - 2x daily - 7x weekly Sidelying Shoulder External Rotation - 10 reps - 2 sets - 2x daily - 7x weekly Standing Bent Over Shoulder Row - 10 reps - 2 sets - 2x daily - 7x weekly

## 2018-11-07 NOTE — Therapy (Signed)
[facility] [address], [place], [place], [address] Phone: [phone]   Fax:  [phone]  Physical Therapy Treatment  Patient Details  Name: Alexandra Mcguire MRN: [id number] Date of Birth: Sep 30, 1970 Referring Provider (PT): Marchia Bond, MD   Encounter Date: 11/07/2018  PT End of Session - 11/07/18 0930    Visit Number  4    Date for PT Re-Evaluation  01/04/19    PT Start Time  4353    PT Stop Time  0928    PT Time Calculation (min)  41 min    Activity Tolerance  Patient tolerated treatment well    Behavior During Therapy  [facility] for tasks assessed/performed       Past Medical History:  Diagnosis Date  . Allergy   . Ankle syndesmosis disruption 02/22/2012  . Complication of anesthesia   . Endometriosis   . Fibroadenoma of breast, left   . Miscarriage 2009   2009  . Nontraumatic tear of supraspinatus tendon, left 08/31/2018  . Ovarian cyst   . PONV (postoperative nausea and vomiting)     Past Surgical History:  Procedure Laterality Date  . BREAST LUMPECTOMY Left 1989, 1991   fibroadenoma  . CESAREAN SECTION  2001  . EXPLORATORY LAPAROTOMY  1994   Endometriosis  . OOPHORECTOMY Left 1996  . ORIF ANKLE FRACTURE  02/21/2012   Procedure: OPEN REDUCTION INTERNAL FIXATION (ORIF) ANKLE FRACTURE;  Surgeon: Johnny Bridge, MD;  Location: [place];  Service: Orthopedics;  Laterality: Right;  . SHOULDER ARTHROSCOPY Right 2012  . SHOULDER ARTHROSCOPY WITH ROTATOR CUFF REPAIR AND SUBACROMIAL DECOMPRESSION Left 08/31/2018   Procedure: SHOULDER ARTHROSCOPY WITH ROTATOR CUFF REPAIR AND SUBACROMIAL DECOMPRESSION;  Surgeon: Marchia Bond, MD;  Location: [place];  Service: Orthopedics;  Laterality: Left;    There were no vitals filed for this visit.  Subjective Assessment - 11/07/18 0855    Subjective  I will need to take a break for a few weeks due to my daughter being home.      Pertinent History  RTC  surgery 08/31/18    Patient Stated Goals  be able to regain strength and mobility    Currently in Pain?  No/denies         [facility] PT Assessment - 11/07/18 0001      Assessment   Medical Diagnosis  Z98.890 (ICD-10-CM) - S/P left rotator cuff repair    Referring Provider (PT)  Marchia Bond, MD      [place] residence    Living Arrangements  Spouse/significant other;Children   1 daughter home, 1 college     Cognition   Overall Cognitive Status  Within Functional Limits for tasks assessed      Observation/Other Assessments   Focus on Therapeutic Outcomes (FOTO)   30% limitation      AROM   AROM Assessment Site  Shoulder    Right/Left Shoulder  Left    Left Shoulder Flexion  135 Degrees    Left Shoulder ABduction  115 Degrees    Left Shoulder Internal Rotation  --   lacking 6 inches vs the Rt   Left Shoulder External Rotation  --   lacking 3 inches vs the Rt     Strength   Strength Assessment Site  Shoulder    Right/Left Shoulder  Left    Left Shoulder Flexion  4/5    Left Shoulder Extension  4+/5  Left Shoulder ABduction  4/5    Left Shoulder Internal Rotation  4+/5    Left Shoulder External Rotation  4/5                   [facility] Adult PT Treatment/Exercise - 11/07/18 0001      Shoulder Exercises: Seated   Other Seated Exercises  3 way raises 1# added 2x10      Shoulder Exercises: Sidelying   External Rotation  Strengthening;Left;20 reps;Weights    External Rotation Weight (lbs)  1    ABduction  Strengthening;Left;20 reps;Weights    ABduction Weight (lbs)  1      Shoulder Exercises: Standing   Other Standing Exercises  cone stack to 2nd shelf with Lt x3.5 minutes      Shoulder Exercises: Pulleys   Other Pulley Exercises  IR x 2 minutes      Shoulder Exercises: ROM/Strengthening   UBE (Upper Arm Bike)  Level 0 x 6 minutes (3/3)             PT Education - 11/07/18 0914    Education Details  Access  Code: [email or web address]    Person(s) Educated  Patient    Methods  Explanation;Demonstration;Handout    Comprehension  Verbalized understanding;Returned demonstration       PT Short Term Goals - 10/12/18 1638      PT SHORT TERM GOAL #1   Title  Pt will demonstrate90 degrees AROM Lt shoulder abduction and flexion for improved functional reaching    Time  6    Period  Weeks    Status  New    Target Date  11/23/18      PT SHORT TERM GOAL #2   Title  Pt will be able to place small weight such as a glass on shelf at shoulder height without increased pain due to improved shoulder strength    Time  6    Period  Weeks    Status  New    Target Date  11/23/18      PT SHORT TERM GOAL #3   Title  Pt will be ind with initial HEP    Time  6    Period  Weeks    Status  New    Target Date  11/23/18      PT SHORT TERM GOAL #4   Title  Pt will be able to reach behind head for putting hair back    Time  6    Period  Weeks    Status  New    Target Date  11/23/18        PT Long Term Goals - 11/07/18 0931      PT LONG TERM GOAL #1   Title  Pt will be able to demonstrate AROM Lt shoulder flexion of at least 120 degrees for improved functional reaching    Status  Achieved      PT LONG TERM GOAL #2   Title  Pt will be ind with advanced HEP    Status  Achieved      PT LONG TERM GOAL #3   Title  Pt will demonstrate at least 4/5 Lt shoulder flexion and abduction for lifting and reaching activities and house hold chores    Status  Partially Met      PT LONG TERM GOAL #4   Title  Pt will report < or = to 29% FOTO    Baseline  30%     Status  Partially Met      PT LONG TERM GOAL #5   Title  Pt will report she is able to get back to 80% of her normal functional activities due to improved strength and ROM    Baseline  70%    Status  Partially Met            Plan - 11/07/18 0930    Clinical Impression Statement  Session today focused on progression of HEP for strength as pt will D/C  today for HEP.  PT provided demo, tactile and verbal cues for technique.  Pt with significant progression of HEP and strength of the Lt shoulder and improved strength.  Pt will D/C today to HEP and follow-up with MD as needed.      PT Next Visit Plan  D/C PT to HEP    PT Home Exercise Plan   Access Code: [email or web address]     Consulted and Agree with Plan of Care  Patient       Patient will benefit from skilled therapeutic intervention in order to improve the following deficits and impairments:     Visit Diagnosis: Stiffness of left shoulder, not elsewhere classified  Muscle weakness (generalized)  Chronic left shoulder pain     Problem List Patient Active Problem List   Diagnosis Date Noted  . Nontraumatic tear of supraspinatus tendon, left 08/31/2018  . Vitamin D deficiency 05/24/2016  . Family history of heart disease 03/26/2016  . Overweight (BMI 25.0-29.9) 03/26/2016  . Partial nontraumatic tear of left rotator cuff 03/15/2016  . Ankle syndesmosis disruption 02/22/2012   PHYSICAL THERAPY DISCHARGE SUMMARY  Visits from Start of Care: 4  Current functional level related to goals / functional outcomes: See above for current status.  Pt requested D/C to HEP due to need to be home with her daughter.  Pt has received education on how to advance HEP for strength and flexibility.     Remaining deficits: Functional strength and A/ROM deficits s/p rotator cuff repair.  Continued gains are expected with HEP progression.     Education / Equipment: HEP Plan: Patient agrees to discharge.  Patient goals were partially met. Patient is being discharged due to the patient's request.  ?????          Sigurd Sos, PT 11/07/18 9:32 AM  [facility] [address] [place], [place], [address] Phone: [phone]   Fax:  [phone]  Name: CARRINGTON MULLENAX MRN: [id number] Date of Birth: 05-26-1971

## 2018-11-28 ENCOUNTER — Ambulatory Visit: Payer: Self-pay | Admitting: Physical Therapy

## 2019-01-09 ENCOUNTER — Encounter: Payer: 59 | Admitting: Family Medicine

## 2019-02-23 DIAGNOSIS — Z0100 Encounter for examination of eyes and vision without abnormal findings: Secondary | ICD-10-CM | POA: Diagnosis not present

## 2019-03-29 ENCOUNTER — Encounter: Payer: 59 | Admitting: Family Medicine

## 2019-04-25 ENCOUNTER — Encounter (HOSPITAL_COMMUNITY): Payer: Self-pay

## 2019-04-25 ENCOUNTER — Ambulatory Visit (HOSPITAL_COMMUNITY)
Admission: EM | Admit: 2019-04-25 | Discharge: 2019-04-25 | Disposition: A | Discharge status: Home / Self Care | Payer: 59

## 2019-04-25 ENCOUNTER — Other Ambulatory Visit: Payer: Self-pay

## 2019-04-25 DIAGNOSIS — H9311 Tinnitus, right ear: Secondary | ICD-10-CM | POA: Diagnosis not present

## 2019-04-25 NOTE — ED Provider Notes (Signed)
[place]    CSN: [id number] Arrival date & time: 04/25/19  1326      History   Chief Complaint Chief Complaint  Patient presents with  . Otalgia    HPI Alexandra Mcguire is a 48 y.o. female.   Patient presents with 2-week history of "buzzing" in her right ear.  She denies ear pain.  She states she has had similar symptoms before when she had allergies and sinus congestion but she does not have those symptoms at this time.  She states she would like to have her ear checked for infection.  She denies fever, chills, sore throat, cough, congestion, rhinorrhea, shortness of breath, or other symptoms.  She denies use of aspirin or prescription medications.  The history is provided by the patient.    Past Medical History:  Diagnosis Date  . Allergy   . Ankle syndesmosis disruption 02/22/2012  . Complication of anesthesia   . Endometriosis   . Fibroadenoma of breast, left   . Miscarriage 2009   2009  . Nontraumatic tear of supraspinatus tendon, left 08/31/2018  . Ovarian cyst   . PONV (postoperative nausea and vomiting)     Patient Active Problem List   Diagnosis Date Noted  . Nontraumatic tear of supraspinatus tendon, left 08/31/2018  . Vitamin D deficiency 05/24/2016  . Family history of heart disease 03/26/2016  . Overweight (BMI 25.0-29.9) 03/26/2016  . Partial nontraumatic tear of left rotator cuff 03/15/2016  . Ankle syndesmosis disruption 02/22/2012    Past Surgical History:  Procedure Laterality Date  . BREAST LUMPECTOMY Left 1989, 1991   fibroadenoma  . CESAREAN SECTION  2001  . EXPLORATORY LAPAROTOMY  1994   Endometriosis  . OOPHORECTOMY Left 1996  . ORIF ANKLE FRACTURE  02/21/2012   Procedure: OPEN REDUCTION INTERNAL FIXATION (ORIF) ANKLE FRACTURE;  Surgeon: Johnny Bridge, MD;  Location: [place];  Service: Orthopedics;  Laterality: Right;  . SHOULDER ARTHROSCOPY Right 2012  . SHOULDER ARTHROSCOPY WITH ROTATOR CUFF REPAIR AND SUBACROMIAL DECOMPRESSION  Left 08/31/2018   Procedure: SHOULDER ARTHROSCOPY WITH ROTATOR CUFF REPAIR AND SUBACROMIAL DECOMPRESSION;  Surgeon: Marchia Bond, MD;  Location: [place];  Service: Orthopedics;  Laterality: Left;    OB History    Gravida  3   Para  2   Term      Preterm      AB      Living  2     SAB      TAB      Ectopic      Multiple      Live Births               Home Medications    Prior to Admission medications   Medication Sig Start Date End Date Taking? Authorizing Provider  baclofen (LIORESAL) 10 MG tablet Take 1 tablet (10 mg total) by mouth 3 (three) times daily. As needed for muscle spasm 08/31/18   Marchia Bond, MD  Cholecalciferol (VITAMIN D-3) 5000 units TABS Take 2,500 Units by mouth daily.    [provider]  fish oil-omega-3 fatty acids 1000 MG capsule Take 1 g by mouth daily.    [provider]  MAGNESIUM GLYCINATE PLUS PO Take 1 capsule by mouth daily.    [provider]  Multiple Vitamin (MULTIVITAMIN WITH MINERALS) TABS Take 1 tablet by mouth daily.    [provider]  ondansetron (ZOFRAN) 4 MG tablet Take 1 tablet (4 mg total) by  mouth every 8 (eight) hours as needed for nausea or vomiting. 08/31/18   Marchia Bond, MD  oxyCODONE (ROXICODONE) 5 MG immediate release tablet Take 1 tablet (5 mg total) by mouth every 4 (four) hours as needed for severe pain. 08/31/18   Marchia Bond, MD  sennosides-docusate sodium (SENOKOT-S) 8.6-50 MG tablet Take 2 tablets by mouth daily. 08/31/18   Marchia Bond, MD  TURMERIC PO Take 1,300 mg by mouth.     [provider]    Family History Family History  Problem Relation Age of Onset  . Heart disease Mother   . Arthritis Mother   . Hypertension Mother   . Fibroids Mother   . Mental illness Father   . Heart disease Father   . Osteosarcoma Father        32  . Hypertension Father   . Arthritis Maternal Grandmother   . Heart disease Maternal Grandmother   .  Heart disease Maternal Grandfather   . Early death Maternal Grandfather   . Heart disease Paternal Grandmother   . Heart disease Paternal Grandfather   . Early death Paternal Grandfather     Social History Social History   Tobacco Use  . Smoking status: Never Smoker  . Smokeless tobacco: Never Used  Substance Use Topics  . Alcohol use: Yes    Comment: occasional  . Drug use: No     Allergies   Sulfa antibiotics   Review of Systems Review of Systems  Constitutional: Negative for chills and fever.  HENT: Positive for tinnitus. Negative for congestion, ear pain, hearing loss, sinus pressure and sore throat.   Eyes: Negative for pain and visual disturbance.  Respiratory: Negative for cough and shortness of breath.   Cardiovascular: Negative for chest pain and palpitations.  Gastrointestinal: Negative for abdominal pain, diarrhea and vomiting.  Genitourinary: Negative for dysuria and hematuria.  Musculoskeletal: Negative for arthralgias and back pain.  Skin: Negative for color change and rash.  Neurological: Negative for seizures and syncope.  All other systems reviewed and are negative.    Physical Exam Triage Vital Signs ED Triage Vitals  Enc Vitals Group     BP 04/25/19 1353 129/75     Pulse Rate 04/25/19 1353 77     Resp 04/25/19 1353 15     Temp 04/25/19 1353 98.7 F (37.1 C)     Temp Source 04/25/19 1353 Oral     SpO2 04/25/19 1353 99 %     Weight --      Height --      Head Circumference --      Peak Flow --      Pain Score 04/25/19 1351 0     Pain Loc --      Pain Edu? --      Excl. in [place]? --    No data found.  Updated Vital Signs BP 129/75 (BP Location: Left Arm)   Pulse 77   Temp 98.7 F (37.1 C) (Oral)   Resp 15   SpO2 99%   Visual Acuity Right Eye Distance:   Left Eye Distance:   Bilateral Distance:    Right Eye Near:   Left Eye Near:    Bilateral Near:     Physical Exam Vitals signs and nursing note reviewed.  Constitutional:       General: She is not in acute distress.    Appearance: She is well-developed.  HENT:     Head: Normocephalic and atraumatic.     Right  Ear: Tympanic membrane and ear canal normal.     Left Ear: Tympanic membrane and ear canal normal.     Nose: Nose normal.     Mouth/Throat:     Mouth: Mucous membranes are moist.     Pharynx: Oropharynx is clear.  Eyes:     Conjunctiva/sclera: Conjunctivae normal.  Neck:     Musculoskeletal: Neck supple.  Cardiovascular:     Rate and Rhythm: Normal rate and regular rhythm.     Heart sounds: No murmur.  Pulmonary:     Effort: Pulmonary effort is normal. No respiratory distress.     Breath sounds: Normal breath sounds.  Abdominal:     Palpations: Abdomen is soft.     Tenderness: There is no abdominal tenderness. There is no guarding or rebound.  Skin:    General: Skin is warm and dry.     Findings: No rash.  Neurological:     Mental Status: She is alert.      UC Treatments / Results  Labs (all labs ordered are listed, but only abnormal results are displayed) Labs Reviewed - No data to display  EKG   Radiology No results found.  Procedures Procedures (including critical care time)  Medications Ordered in UC Medications - No data to display  Initial Impression / Assessment and Plan / UC Course  I have reviewed the triage vital signs and the nursing notes.  Pertinent labs & imaging results that were available during my care of the patient were reviewed by me and considered in my medical decision making (see chart for details).    Right ear ringing.  Instructed patient to take OTC Mucinex for 2 to 3 days to see if her symptoms improve.  Instructed her to follow-up with ENT if the noise in her ear continues.  Instructed her to return here if she develops other symptoms of allergies or sinus congestion.  Patient agrees with plan of care.     Final Clinical Impressions(s) / UC Diagnoses   Final diagnoses:  Ringing in the ear,  right     Discharge Instructions     Your ear does not appear to be infected today.    Take over-the-counter Mucinex 1200 mg every 12 hours for 2 to 3 days.  If this does not improve the noise in your ear, follow-up with the ENT listed below.        ED Prescriptions    None     Controlled Substance Prescriptions [place] Controlled Substance Registry consulted? Not Applicable   Sharion Balloon, NP 04/25/19 1425

## 2019-04-25 NOTE — Discharge Instructions (Addendum)
Your ear does not appear to be infected today.    Take over-the-counter Mucinex 1200 mg every 12 hours for 2 to 3 days.  If this does not improve the noise in your ear, follow-up with the ENT listed below.

## 2019-04-25 NOTE — ED Triage Notes (Signed)
Patient presents to Urgent Care with complaints of low right ear buzzing since 2 weeks ago. Patient reports she has tried some otc meds including zyrtec, sudafed, and afrin and flonase but it has not improved.

## 2020-05-11 DIAGNOSIS — Z20822 Contact with and (suspected) exposure to covid-19: Secondary | ICD-10-CM | POA: Diagnosis not present

## 2020-05-30 DIAGNOSIS — Z23 Encounter for immunization: Secondary | ICD-10-CM | POA: Diagnosis not present

## 2020-06-25 ENCOUNTER — Ambulatory Visit: Payer: 59 | Admitting: Family Medicine

## 2020-07-08 ENCOUNTER — Ambulatory Visit (INDEPENDENT_AMBULATORY_CARE_PROVIDER_SITE_OTHER)
Admission: RE | Admit: 2020-07-08 | Discharge: 2020-07-08 | Disposition: A | Discharge status: Home / Self Care | Payer: 59 | Source: Ambulatory Visit | Attending: Family Medicine | Admitting: Family Medicine

## 2020-07-08 ENCOUNTER — Encounter: Payer: Self-pay | Admitting: Family Medicine

## 2020-07-08 ENCOUNTER — Ambulatory Visit: Payer: 59 | Admitting: Family Medicine

## 2020-07-08 ENCOUNTER — Other Ambulatory Visit: Payer: Self-pay

## 2020-07-08 ENCOUNTER — Other Ambulatory Visit: Payer: Self-pay | Admitting: Family Medicine

## 2020-07-08 ENCOUNTER — Ambulatory Visit: Payer: Self-pay

## 2020-07-08 VITALS — BP 100/84 | HR 66 | Ht 65.0 in | Wt 170.0 lb

## 2020-07-08 DIAGNOSIS — M2550 Pain in unspecified joint: Secondary | ICD-10-CM

## 2020-07-08 DIAGNOSIS — G8929 Other chronic pain: Secondary | ICD-10-CM | POA: Diagnosis not present

## 2020-07-08 DIAGNOSIS — M75102 Unspecified rotator cuff tear or rupture of left shoulder, not specified as traumatic: Secondary | ICD-10-CM

## 2020-07-08 DIAGNOSIS — M25512 Pain in left shoulder: Secondary | ICD-10-CM

## 2020-07-08 LAB — CBC WITH DIFFERENTIAL/PLATELET
Basophils Absolute: 0.1 10*3/uL (ref 0.0–0.1)
Basophils Relative: 2 % (ref 0.0–3.0)
Eosinophils Absolute: 0.2 10*3/uL (ref 0.0–0.7)
Eosinophils Relative: 3.6 % (ref 0.0–5.0)
HCT: 43 % (ref 36.0–46.0)
Hemoglobin: 14.4 g/dL (ref 12.0–15.0)
Lymphocytes Relative: 34.7 % (ref 12.0–46.0)
Lymphs Abs: 1.7 10*3/uL (ref 0.7–4.0)
MCHC: 33.5 g/dL (ref 30.0–36.0)
MCV: 99.3 fl (ref 78.0–100.0)
Monocytes Absolute: 0.5 10*3/uL (ref 0.1–1.0)
Monocytes Relative: 10.8 % (ref 3.0–12.0)
Neutro Abs: 2.4 10*3/uL (ref 1.4–7.7)
Neutrophils Relative %: 48.9 % (ref 43.0–77.0)
Platelets: 247 10*3/uL (ref 150.0–400.0)
RBC: 4.33 Mil/uL (ref 3.87–5.11)
RDW: 12.7 % (ref 11.5–15.5)
WBC: 4.9 10*3/uL (ref 4.0–10.5)

## 2020-07-08 LAB — COMPREHENSIVE METABOLIC PANEL
ALT: 11 U/L (ref 0–35)
AST: 14 U/L (ref 0–37)
Albumin: 4.1 g/dL (ref 3.5–5.2)
Alkaline Phosphatase: 52 U/L (ref 39–117)
BUN: 23 mg/dL (ref 6–23)
CO2: 25 mEq/L (ref 19–32)
Calcium: 8.9 mg/dL (ref 8.4–10.5)
Chloride: 104 mEq/L (ref 96–112)
Creatinine, Ser: 0.94 mg/dL (ref 0.40–1.20)
GFR: 71.19 mL/min (ref >60.00)
Glucose, Bld: 93 mg/dL (ref 70–99)
Potassium: 4.2 mEq/L (ref 3.5–5.1)
Sodium: 135 mEq/L (ref 135–145)
Total Bilirubin: 0.4 mg/dL (ref 0.2–1.2)
Total Protein: 7.5 g/dL (ref 6.0–8.3)

## 2020-07-08 LAB — C-REACTIVE PROTEIN: CRP: <1 mg/dL (ref 0.5–20.0)

## 2020-07-08 LAB — VITAMIN D 25 HYDROXY (VIT D DEFICIENCY, FRACTURES): VITD: 48.16 ng/mL (ref 30.00–100.00)

## 2020-07-08 LAB — SEDIMENTATION RATE: Sed Rate: 5 mm/hr (ref 0–20)

## 2020-07-08 LAB — URIC ACID: Uric Acid, Serum: 3.5 mg/dL (ref 2.4–7.0)

## 2020-07-08 MED ORDER — MELOXICAM 15 MG PO TABS: 15.0000 mg | ORAL_TABLET | Freq: Every day | ORAL | 0 refills | Status: DC

## 2020-07-08 MED FILL — MELOXICAM 15 MG TABLET: 15 | 30 days supply | Qty: 30 | Fill #0

## 2020-07-08 NOTE — Assessment & Plan Note (Signed)
Patient has what appears to be more of a tear noted again of the supraspinatus.  Questionable scar tissue formation.  No underlying cortical irregularity noted but likely secondary to the decrease of the surgical intervention.  Discussed with patient in great length.  Patient is going to take meloxicam.  Warned of potential side effects but should do relatively well.  Patient has been having to take anti-inflammatories regularly.  We discussed injection but patient declined.  Patient has done formal physical therapy and is very active.  We discussed changing range of motion exercises.  Follow-up with me again in 4 weeks.  Due to the severity of the problem I would consider MRI sooner than later if she continues to have trouble

## 2020-07-08 NOTE — Progress Notes (Signed)
[facility] [address] [name] Phone: [phone] Subjective:   I Alexandra Mcguire am serving as a Education administrator for Dr. Hulan Saas.  This visit occurred during the SARS-CoV-2 public health emergency.  Safety protocols were in place, including screening questions prior to the visit, additional usage of staff PPE, and extensive cleaning of exam room while observing appropriate contact time as indicated for disinfecting solutions.   I'm seeing this patient by the request  of:  [name], [name], DO  CC: Left shoulder pain  [id number]  [name] is a 49 y.o. female coming in with complaint of left shoulder pain. Patient had subacromial decompression and RTC repair in January 2020. Patient states the shoulder pain is chronic. Had surgery on the shoulder since she last seen [place]. Still having impingement issues. Some days she has limited ROM. Left sided neck pain that radiates into the shoulder blade. Numbness and tingling into the finger tips. Pain is sharp with movement, dull, and achy. Patient uses ice majority of the time and states that it helps. Also using Morton. 8/10 at its worse. States that she is very active and hasn't done anything for 3 weeks. Pushing movements and getting a water bottle out the fridge are painful. Abduction and external rotation is also painful.       Past Medical History:  Diagnosis Date  . Allergy   . Ankle syndesmosis disruption 02/22/2012  . Complication of anesthesia   . Endometriosis   . Fibroadenoma of breast, left   . Miscarriage 2009   2009  . Nontraumatic tear of supraspinatus tendon, left 08/31/2018  . Ovarian cyst   . PONV (postoperative nausea and vomiting)    Past Surgical History:  Procedure Laterality Date  . BREAST LUMPECTOMY Left 1989, 1991   fibroadenoma  . CESAREAN SECTION  2001  . EXPLORATORY LAPAROTOMY  1994   Endometriosis  . OOPHORECTOMY Left 1996  . ORIF ANKLE FRACTURE  02/21/2012     Procedure: OPEN REDUCTION INTERNAL FIXATION (ORIF) ANKLE FRACTURE;  Surgeon: Johnny Bridge, MD;  Location: [facility];  Service: Orthopedics;  Laterality: Right;  . SHOULDER ARTHROSCOPY Right 2012  . SHOULDER ARTHROSCOPY WITH ROTATOR CUFF REPAIR AND SUBACROMIAL DECOMPRESSION Left 08/31/2018   Procedure: SHOULDER ARTHROSCOPY WITH ROTATOR CUFF REPAIR AND SUBACROMIAL DECOMPRESSION;  Surgeon: Marchia Bond, MD;  Location: [facility];  Service: Orthopedics;  Laterality: Left;   Social History   Socioeconomic History  . Marital status: Married    Spouse name: Not on file  . Number of children: Not on file  . Years of education: Not on file  . Highest education level: Not on file  Occupational History  . Occupation: Physiological scientist  . Occupation: Designer, jewellery  Tobacco Use  . Smoking status: Never Smoker  . Smokeless tobacco: Never Used  Vaping Use  . Vaping Use: Never used  Substance and Sexual Activity  . Alcohol use: Yes    Comment: occasional  . Drug use: No  . Sexual activity: Yes    Partners: Male    Birth control/protection: Pill    Comment: Married  Other Topics Concern  . Not on file  Social History Narrative   Stanton Kidney to [place]. 2 children named Alexandra Mcguire and Alexandra Mcguire.   Masters degree, employed as a Glass blower/designer.   Drinks caffeine, takes herbal remedies, takes a daily vitamin.   Wears her seatbelt, wears a bicycle helmet, smoke detector in the home.  Exercises routinely.   Feels safe in her  relationships.   Social Determinants of Health   Financial Resource Strain:   . Difficulty of Paying Living Expenses: Not on file  Food Insecurity:   . Worried About Charity fundraiser in the Last Year: Not on file  . Ran Out of Food in the Last Year: Not on file  Transportation Needs:   . Lack of Transportation (Medical): Not on file  . Lack of Transportation (Non-Medical): Not on file  Physical Activity:   . Days of Exercise per Week:  Not on file  . Minutes of Exercise per Session: Not on file  Stress:   . Feeling of Stress : Not on file  Social Connections:   . Frequency of Communication with Friends and Family: Not on file  . Frequency of Social Gatherings with Friends and Family: Not on file  . Attends Religious Services: Not on file  . Active Member of Clubs or Organizations: Not on file  . Attends Archivist Meetings: Not on file  . Marital Status: Not on file   Allergies  Allergen Reactions  . Sulfa Antibiotics Rash   Family History  Problem Relation Age of Onset  . Heart disease Mother   . Arthritis Mother   . Hypertension Mother   . Fibroids Mother   . Mental illness Father   . Heart disease Father   . Osteosarcoma Father        49  . Hypertension Father   . Arthritis Maternal Grandmother   . Heart disease Maternal Grandmother   . Heart disease Maternal Grandfather   . Early death Maternal Grandfather   . Heart disease Paternal Grandmother   . Heart disease Paternal Grandfather   . Early death Paternal Grandfather        Current Outpatient Medications (Analgesics):  .  meloxicam (MOBIC) 15 MG tablet, Take 1 tablet (15 mg total) by mouth daily.   Current Outpatient Medications (Other):  Marland Kitchen  Cholecalciferol (VITAMIN D-3) 5000 units TABS, Take 2,500 Units by mouth daily. .  fish oil-omega-3 fatty acids 1000 MG capsule, Take 1 g by mouth daily. Marland Kitchen  MAGNESIUM GLYCINATE PLUS PO, Take 1 capsule by mouth daily. .  Multiple Vitamin (MULTIVITAMIN WITH MINERALS) TABS, Take 1 tablet by mouth daily. .  TURMERIC PO, Take 1,300 mg by mouth.    Reviewed prior external information including notes and imaging from  primary care provider As well as notes that were available from care everywhere and other healthcare systems.  Past medical history, social, surgical and family history all reviewed in electronic medical record.  No pertanent information unless stated regarding to the chief  complaint.   Review of Systems:  No headache, visual changes, nausea, vomiting, diarrhea, constipation, dizziness, abdominal pain, skin rash, fevers, chills, night sweats, weight loss, swollen lymph nodes, body aches, joint swelling, chest pain, shortness of breath, mood changes. POSITIVE muscle aches  Objective  Blood pressure 100/84, pulse 66, height 5\' 5"  (1.651 m), weight 170 lb (77.1 kg), SpO2 99 %.   General: No apparent distress alert and oriented x3 mood and affect normal, dressed appropriately.  HEENT: Pupils equal, extraocular movements intact  Respiratory: Patient's speak in full sentences and does not appear short of breath  Cardiovascular: No lower extremity edema, non tender, no erythema  Left shoulder exam shows the patient does have near full range of motion +5 strength compared to the contralateral side.  Positive impingement with neers mild obrians  Neg crossover   Ltd msk u/s was preformed and interpreted by Abby Potash us/ of left subscapularis.  Does have what appears to be a possibility of a new tear noted.  Hypoechoic changes.  Patient also has a reactive bursitis of the anterior aspect.  Patient has significant calcific changes versus scar tissue formation of the supraspinatus which makes it difficult to further evaluate.  Patient also has cortical irregularities and likely secondary to postsurgical changes but potential mild thinning of the bone.  Mild increase in Doppler flow in this area. Impression: Questionable new subscapularis tear with scar tissue formation likely of the supraspinatus    Impression and Recommendations:     The above documentation has been reviewed and is accurate and complete Lyndal Pulley, DO

## 2020-07-08 NOTE — Patient Instructions (Addendum)
Good to see you Xray today Labs today Continue exercises Keep hands within peripheral vision Meloixcam 15 mg daily for 10 days then as needed Do not use NSAIDS such as Advil or Aleve when taking Meloxicam. It is ok to use Tylenol for additional pain relief Stop other antiinflammatories See me again in 4-5 weeks

## 2020-07-12 DIAGNOSIS — Z20822 Contact with and (suspected) exposure to covid-19: Secondary | ICD-10-CM | POA: Diagnosis not present

## 2020-07-20 ENCOUNTER — Encounter: Payer: Self-pay | Admitting: Family Medicine

## 2020-07-21 ENCOUNTER — Other Ambulatory Visit: Payer: Self-pay | Admitting: Obstetrics and Gynecology

## 2020-07-21 DIAGNOSIS — Z1231 Encounter for screening mammogram for malignant neoplasm of breast: Secondary | ICD-10-CM

## 2020-07-29 ENCOUNTER — Ambulatory Visit
Admission: RE | Admit: 2020-07-29 | Discharge: 2020-07-29 | Disposition: A | Discharge status: Home / Self Care | Payer: 59 | Source: Ambulatory Visit

## 2020-07-29 ENCOUNTER — Other Ambulatory Visit: Payer: Self-pay

## 2020-07-29 DIAGNOSIS — Z1231 Encounter for screening mammogram for malignant neoplasm of breast: Secondary | ICD-10-CM

## 2020-08-07 ENCOUNTER — Ambulatory Visit: Payer: 59 | Admitting: Family Medicine

## 2020-08-28 ENCOUNTER — Ambulatory Visit: Payer: 59 | Admitting: Family Medicine

## 2020-09-04 DIAGNOSIS — Z20828 Contact with and (suspected) exposure to other viral communicable diseases: Secondary | ICD-10-CM | POA: Diagnosis not present

## 2021-07-17 IMAGING — DX DG SHOULDER 2+V*L*
3 series · 3 of 3 positions shown · non-contrast
Comparison: 03/23/2016

CLINICAL DATA: Left shoulder pain chronic

EXAM:
LEFT SHOULDER - 2+ VIEW

[grashey]
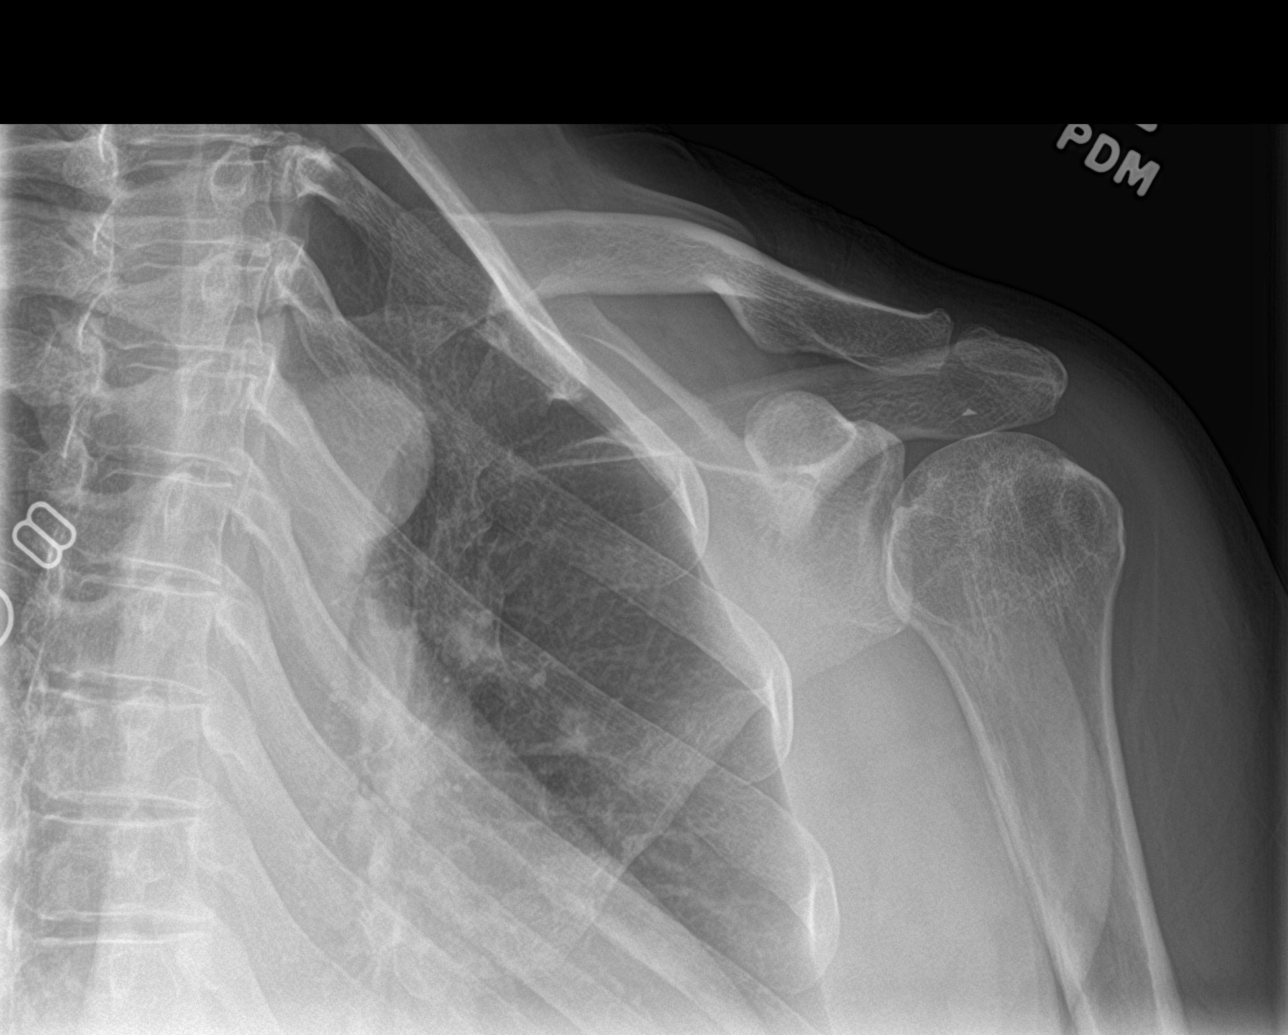

[y view]
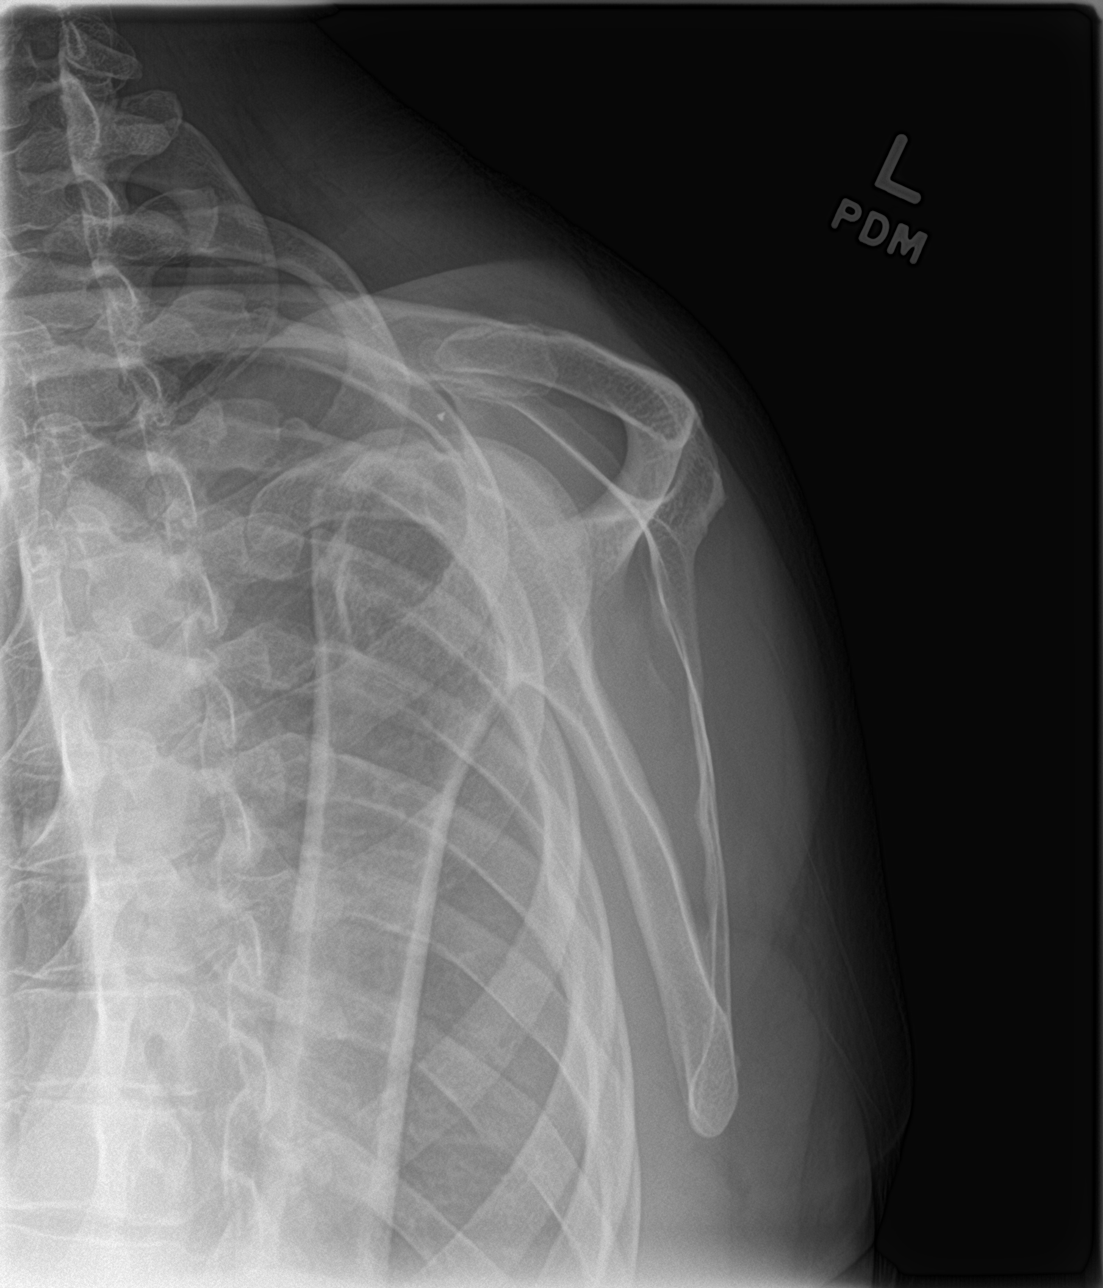

[shoulder axial]
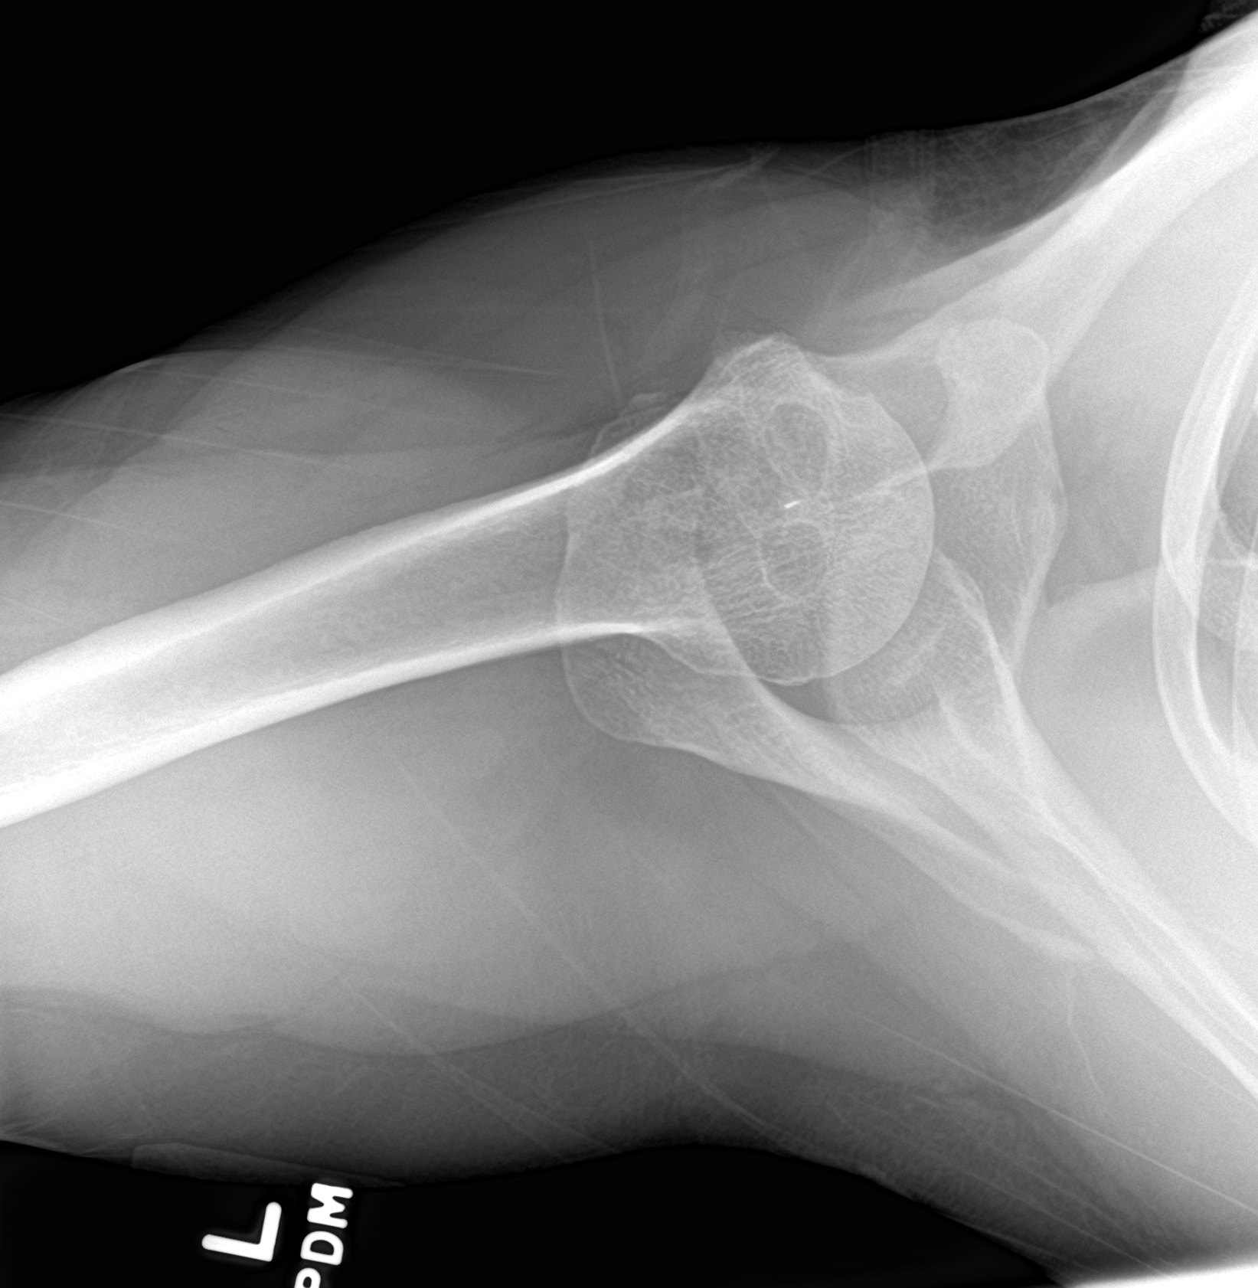

[3 of 3 positions shown; findings below may reference images not displayed]

FINDINGS: Small radiopaque density is seen projecting in the subacromial
space. This is new since prior study and concerning for
intra-articular foreign body. Mild joint space narrowing in the AC
and glenohumeral joints. No acute bony abnormality. Specifically, no
fracture, subluxation, or dislocation.
IMPRESSION: Small density projects over the subacromial space concerning for
intra-articular foreign body.

No acute bony abnormality.

## 2022-02-16 LAB — COLOGUARD: Cologuard: NEGATIVE

## 2022-02-17 NOTE — Telephone Encounter (Signed)
PCP removed. 

## 2022-09-29 NOTE — Progress Notes (Unsigned)
[facility] [place] Phone: [phone] Subjective:   Fontaine No, am serving as a scribe for Dr. Hulan Saas.  I'm seeing this patient by the request  of:  No primary care provider on file.  CC: Left shoulder pain  HMC:[id number]  07/08/2020 Patient has what appears to be more of a tear noted again of the supraspinatus. Questionable scar tissue formation. No underlying cortical irregularity noted but likely secondary to the decrease of the surgical intervention. Discussed with patient in great length. Patient is going to take meloxicam. Warned of potential side effects but should do relatively well. Patient has been having to take anti-inflammatories regularly. We discussed injection but patient declined. Patient has done formal physical therapy and is very active. We discussed changing range of motion exercises. Follow-up with me again in 4 weeks. Due to the severity of the problem I would consider MRI sooner than later if she continues to have trouble   Update 09/30/2022 Alexandra Mcguire is a 52 y.o. female coming in with complaint of L shoulder pain. Patient states that she had increase in pain in L scapula and into the middle deltoid. No new injury. Pain will radiate into her elbow. Painful to reach out to her side and when she uses wider grip at the gym. Using Aleve for pain relief.       Past Medical History:  Diagnosis Date   Allergy    Ankle syndesmosis disruption 01/23/9475   Complication of anesthesia    Endometriosis    Fibroadenoma of breast, left    Miscarriage 2009   2009   Nontraumatic tear of supraspinatus tendon, left 08/31/2018   Ovarian cyst    PONV (postoperative nausea and vomiting)    Past Surgical History:  Procedure Laterality Date   BREAST LUMPECTOMY Left 1989, 1991   fibroadenoma   [place]   Endometriosis   OOPHORECTOMY Left 1996   ORIF ANKLE  FRACTURE  02/21/2012   Procedure: OPEN REDUCTION INTERNAL FIXATION (ORIF) ANKLE FRACTURE;  Surgeon: Johnny Bridge, MD;  Location: [facility];  Service: Orthopedics;  Laterality: Right;   SHOULDER ARTHROSCOPY Right 2012   SHOULDER ARTHROSCOPY WITH ROTATOR CUFF REPAIR AND SUBACROMIAL DECOMPRESSION Left 08/31/2018   Procedure: SHOULDER ARTHROSCOPY WITH ROTATOR CUFF REPAIR AND SUBACROMIAL DECOMPRESSION;  Surgeon: Marchia Bond, MD;  Location: [facility];  Service: Orthopedics;  Laterality: Left;   Social History   Socioeconomic History   Marital status: Married    Spouse name: Not on file   Number of children: Not on file   Years of education: Not on file   Highest education level: Not on file  Occupational History   Occupation: Physiological scientist   Occupation: Designer, jewellery  Tobacco Use   Smoking status: Never   Smokeless tobacco: Never  Vaping Use   Vaping Use: Never used  Substance and Sexual Activity   Alcohol use: Yes    Comment: occasional   Drug use: No   Sexual activity: Yes    Partners: Male    Birth control/protection: Pill    Comment: Married  Other Topics Concern   Not on file  Social History Narrative   Stanton Kidney to [place]. 2 children named Wells Guiles and [place].   Masters degree, employed as a Glass blower/designer.   Drinks caffeine, takes herbal remedies, takes a daily vitamin.   Wears her seatbelt, wears a bicycle helmet,  smoke detector in the home.   Exercises routinely.   Feels safe in her  relationships.   Social Determinants of Health   Financial Resource Strain: Not on file  Food Insecurity: Not on file  Transportation Needs: Not on file  Physical Activity: Not on file  Stress: Not on file  Social Connections: Not on file   Allergies  Allergen Reactions   Sulfa Antibiotics Rash   Family History  Problem Relation Age of Onset   Heart disease Mother    Arthritis Mother    Hypertension Mother    Fibroids Mother    Mental  illness Father    Heart disease Father    Osteosarcoma Father        34   Hypertension Father    Arthritis Maternal Grandmother    Heart disease Maternal Grandmother    Heart disease Maternal Grandfather    Early death Maternal Grandfather    Heart disease Paternal Grandmother    Heart disease Paternal Grandfather    Early death Paternal Grandfather          Current Outpatient Medications (Other):    Cholecalciferol (VITAMIN D-3) 5000 units TABS, Take 2,500 Units by mouth daily.   fish oil-omega-3 fatty acids 1000 MG capsule, Take 1 g by mouth daily.   MAGNESIUM GLYCINATE PLUS PO, Take 1 capsule by mouth daily.   Multiple Vitamin (MULTIVITAMIN WITH MINERALS) TABS, Take 1 tablet by mouth daily.   TURMERIC PO, Take 1,300 mg by mouth.    Reviewed prior external information including notes and imaging from  primary care provider As well as notes that were available from care everywhere and other healthcare systems.  Past medical history, social, surgical and family history all reviewed in electronic medical record.  No pertanent information unless stated regarding to the chief complaint.   Review of Systems:  No headache, visual changes, nausea, vomiting, diarrhea, constipation, dizziness, abdominal pain, skin rash, fevers, chills, night sweats, weight loss, swollen lymph nodes, body aches, joint swelling, chest pain, shortness of breath, mood changes. POSITIVE muscle aches  Objective  Blood pressure 118/82, pulse 64, height '5\' 5"'$  (1.651 m), weight 155 lb (70.3 kg), SpO2 98 %.   General: No apparent distress alert and oriented x3 mood and affect normal, dressed appropriately.  HEENT: Pupils equal, extraocular movements intact  Respiratory: Patient's speak in full sentences and does not appear short of breath  Cardiovascular: No lower extremity edema, non tender, no erythema  Left shoulder exam does have a positive impingement.  Positive crepitus noted with certain range of  motion.  Some rotation with internal rotation to sacrum.  Weakness with 3 out of 5 strength of the rotator cuff compared to the contralateral side  Limited muscular skeletal ultrasound was performed and interpreted by Hulan Saas, M  Limited ultrasound shows that patient does have significant calcific changes noted of the supraspinatus which what appears to be some retraction also noted.  Patient does have tenderness to palpation even to compression with the probe today.  Patient does have what appears to be chronic subluxation of the proximal bicep tendon laterally with hypoechoic changes near the bicep and labral attachment. Impression: Significant abnormalities of the rotator cuff as well as labral pathology.    Impression and Recommendations:    The above documentation has been reviewed and is accurate and complete Lyndal Pulley, DO

## 2022-09-30 ENCOUNTER — Ambulatory Visit (INDEPENDENT_AMBULATORY_CARE_PROVIDER_SITE_OTHER): Payer: 59 | Admitting: Family Medicine

## 2022-09-30 ENCOUNTER — Encounter: Payer: Self-pay | Admitting: Family Medicine

## 2022-09-30 ENCOUNTER — Ambulatory Visit: Payer: Self-pay

## 2022-09-30 ENCOUNTER — Ambulatory Visit (INDEPENDENT_AMBULATORY_CARE_PROVIDER_SITE_OTHER): Payer: 59

## 2022-09-30 VITALS — BP 118/82 | HR 64 | Ht 65.0 in | Wt 155.0 lb

## 2022-09-30 DIAGNOSIS — M25512 Pain in left shoulder: Secondary | ICD-10-CM | POA: Diagnosis not present

## 2022-09-30 DIAGNOSIS — G8929 Other chronic pain: Secondary | ICD-10-CM | POA: Diagnosis not present

## 2022-09-30 DIAGNOSIS — M75102 Unspecified rotator cuff tear or rupture of left shoulder, not specified as traumatic: Secondary | ICD-10-CM | POA: Diagnosis not present

## 2022-09-30 NOTE — Patient Instructions (Addendum)
Xray today MRA L shoulder [id number]

## 2022-09-30 NOTE — Assessment & Plan Note (Signed)
Patient has what appears to be a acute tear noted.  Calcific changes noted as well.  Hypoechoic changes that is consistent also with an acute bursitis.  Patient does have subluxation of the bicep tendon that is concerning for an anterior tear.  Due to the weakness, chronic pain, waking her up at night do feel advanced imaging is warranted as well.  Questionable foreign body or loose body also noted.  Patient will have an MR arthrogram for further evaluation and see if any surgical intervention is necessary.

## 2022-10-01 MED ORDER — TIZANIDINE HCL 4 MG PO TABS: 4.0000 mg | ORAL_TABLET | Freq: Every evening | ORAL | 2 refills | Status: AC

## 2022-10-19 ENCOUNTER — Ambulatory Visit
Admission: RE | Admit: 2022-10-19 | Discharge: 2022-10-19 | Disposition: A | Discharge status: Home / Self Care | Payer: 59 | Source: Ambulatory Visit | Attending: Family Medicine | Admitting: Family Medicine

## 2022-10-19 DIAGNOSIS — G8929 Other chronic pain: Secondary | ICD-10-CM

## 2022-10-19 MED ORDER — IOPAMIDOL (ISOVUE-M 200) INJECTION 41%
15.0000 mL | Freq: Once | INTRAMUSCULAR | Status: AC
  Administered 2022-10-19: 15 mL via INTRA_ARTICULAR

## 2022-10-21 ENCOUNTER — Other Ambulatory Visit: Payer: Self-pay

## 2022-10-21 ENCOUNTER — Encounter: Payer: Self-pay | Admitting: Family Medicine

## 2022-10-21 DIAGNOSIS — G8929 Other chronic pain: Secondary | ICD-10-CM

## 2023-01-04 LAB — HEPATIC FUNCTION PANEL
ALT: 14 U/L (ref 7–35)
AST: 17 (ref 13–35)
Alkaline Phosphatase: 72 (ref 25–125)
Bilirubin, Total: 0.4

## 2023-01-04 LAB — TESTOSTERONE: Testosterone: 28

## 2023-01-04 LAB — CBC AND DIFFERENTIAL
HCT: 43 (ref 36–46)
Hemoglobin: 14.3 (ref 12.0–16.0)
Neutrophils Absolute: 1.7
Platelets: 299 10*3/uL (ref 150–400)
WBC: 4.2

## 2023-01-04 LAB — BASIC METABOLIC PANEL
BUN: 31 — AB (ref 4–21)
CO2: 25 — AB (ref 13–22)
Chloride: 107 (ref 99–108)
Creatinine: 1 (ref 0.5–1.1)
Glucose: 82
Potassium: 4.4 mEq/L (ref 3.5–5.1)
Sodium: 138 (ref 137–147)

## 2023-01-04 LAB — LIPID PANEL
Cholesterol: 177 (ref 0–200)
HDL: 67 (ref 35–70)
LDL Cholesterol: 101
Triglycerides: 46 (ref 40–160)

## 2023-01-04 LAB — TSH: TSH: 2.2 (ref 0.41–5.90)

## 2023-01-04 LAB — COMPREHENSIVE METABOLIC PANEL
Albumin: 4.1 (ref 3.5–5.0)
Calcium: 9.5 (ref 8.7–10.7)
eGFR: 66

## 2023-01-04 LAB — CBC: RBC: 4.41 (ref 3.87–5.11)

## 2023-01-04 LAB — VITAMIN D 25 HYDROXY (VIT D DEFICIENCY, FRACTURES): Vit D, 25-Hydroxy: 44.3

## 2023-01-04 LAB — HEMOGLOBIN A1C: Hemoglobin A1C: 4.8

## 2023-03-21 ENCOUNTER — Other Ambulatory Visit: Payer: Self-pay | Admitting: Family Medicine

## 2023-03-21 ENCOUNTER — Ambulatory Visit (INDEPENDENT_AMBULATORY_CARE_PROVIDER_SITE_OTHER): Payer: 59 | Admitting: Family Medicine

## 2023-03-21 ENCOUNTER — Encounter: Payer: Self-pay | Admitting: Family Medicine

## 2023-03-21 VITALS — BP 129/85 | HR 55 | Temp 97.8°F | Ht 65.0 in

## 2023-03-21 DIAGNOSIS — Z23 Encounter for immunization: Secondary | ICD-10-CM | POA: Diagnosis not present

## 2023-03-21 DIAGNOSIS — Z8249 Family history of ischemic heart disease and other diseases of the circulatory system: Secondary | ICD-10-CM | POA: Diagnosis not present

## 2023-03-21 DIAGNOSIS — Z7689 Persons encountering health services in other specified circumstances: Secondary | ICD-10-CM

## 2023-03-21 DIAGNOSIS — Z1231 Encounter for screening mammogram for malignant neoplasm of breast: Secondary | ICD-10-CM

## 2023-03-21 DIAGNOSIS — Z0000 Encounter for general adult medical examination without abnormal findings: Secondary | ICD-10-CM

## 2023-03-21 NOTE — Progress Notes (Signed)
Patient ID: Alexandra Mcguire, female  DOB: Feb 16, 1971, 52 y.o.   MRN: [id number] Patient Care Team    Relationship Specialty Notifications Start End  Natalia Leatherwood, DO PCP - General Family Medicine  03/21/23   Candice Camp, MD Consulting Physician Obstetrics and Gynecology  03/26/16     Chief Complaint  Patient presents with   Establish Care   Annual Exam    Subjective:  Alexandra Mcguire is a 52 y.o.  female present for new patient establishment. All past medical history, surgical history, allergies, family history, immunizations, medications and social history were updated in the electronic medical record today. All recent labs, ED visits and hospitalizations within the last year were reviewed.   Health maintenance: updated 01/05/2018 Colonoscopy: no fhx. Cologuard last yr- negative, 3 yr repeat 01/2025. Mammogram: No Fhx.  Cervical cancer screening: PAP 2023 at [facility]. Reported normal. 5 yr follow up.   Immunizations: tdap 2018, Influenza (encouraged yearly), shingrix #1 today. Nurse visit in 3-6 mos for #2. Infectious disease screening: HIV completed 2016, hep c- declined DEXA: Never Patient has a Dental home. Hospitalizations/ED visits: reviewed     03/21/2023   11:38 AM 01/05/2018    9:04 AM 07/13/2016   10:50 AM 03/26/2016   10:08 AM  Depression screen PHQ 2/9  Decreased Interest 0 0 0 0  Down, Depressed, Hopeless 0 0 0 0  PHQ - 2 Score 0 0 0 0       No data to display                03/21/2023   11:38 AM 01/05/2018    9:04 AM 03/26/2016   10:08 AM  Fall Risk   Falls in the past year? 0 No No  Number falls in past yr: 0    Injury with Fall? 0      Immunization History  Administered Date(s) Administered   H1N1 06/12/2008   Hepatitis A 03/18/2004   Hepatitis A, Adult 02/04/2017   Influenza,inj,Quad PF,6+ Mos 05/20/2017   Influenza-Unspecified 05/24/2015, 05/23/2016, 05/23/2018, 05/24/2019   Td 03/18/2004, 08/30/2007   Tdap 08/30/2007, 02/04/2017    Typhoid Inactivated 03/18/2004   Typhoid Live 02/04/2017   Yellow Fever 03/18/2004   Zoster Recombinant(Shingrix) 03/21/2023    No results found.  Past Medical History:  Diagnosis Date   Allergy    Ankle syndesmosis disruption 02/22/2012   Complication of anesthesia    Endometriosis    Fibroadenoma of breast, left    Miscarriage 2009   2009   Nontraumatic tear of supraspinatus tendon, left 08/31/2018   Ovarian cyst    Partial nontraumatic tear of left rotator cuff 03/15/2016   Injection 03/23/2016     PONV (postoperative nausea and vomiting)    Allergies  Allergen Reactions   Sulfa Antibiotics Rash   Past Surgical History:  Procedure Laterality Date   BREAST LUMPECTOMY Left 1989, 1991   fibroadenoma   CESAREAN SECTION  2001   EXPLORATORY LAPAROTOMY  1994   Endometriosis   OOPHORECTOMY Left 1996   ORIF ANKLE FRACTURE  02/21/2012   Procedure: OPEN REDUCTION INTERNAL FIXATION (ORIF) ANKLE FRACTURE;  Surgeon: Eulas Post, MD;  Location: [facility] OR;  Service: Orthopedics;  Laterality: Right;   SHOULDER ARTHROSCOPY Right 2012   SHOULDER ARTHROSCOPY WITH ROTATOR CUFF REPAIR AND SUBACROMIAL DECOMPRESSION Left 08/31/2018   Procedure: SHOULDER ARTHROSCOPY WITH ROTATOR CUFF REPAIR AND SUBACROMIAL DECOMPRESSION;  Surgeon: Teryl Lucy, MD;  Location: [facility];  Service:  Orthopedics;  Laterality: Left;   Family History  Problem Relation Age of Onset   Heart disease Mother    Arthritis Mother    Hypertension Mother    Fibroids Mother    Cancer Father        sarcoma (abdominal)   Mental illness Father    Heart disease Father    Hypertension Father    Arthritis Maternal Grandmother    Heart disease Maternal Grandmother    Heart disease Maternal Grandfather    Early death Maternal Grandfather    Heart disease Paternal Grandmother    Heart disease Paternal Grandfather    Early death Paternal Grandfather    Social History   Social History Narrative   Mary to  [place]. 2 children named Lurena [place] and [place].   Masters degree, employed as a Agricultural consultant NP   Wears her seatbelt, wears a bicycle helmet, smoke detector in the home.   Exercises routinely.   Feels safe in her  relationships.    Allergies as of 03/21/2023       Reactions   Sulfa Antibiotics Rash        Medication List        Accurate as of March 21, 2023 11:43 AM. If you have any questions, ask your nurse or doctor.          STOP taking these medications    MAGNESIUM GLYCINATE PLUS PO Stopped by: Felix Pacini   multivitamin with minerals Tabs tablet Stopped by: Felix Pacini   TURMERIC PO Stopped by: Felix Pacini   Vitamin D-3 125 MCG (5000 UT) Tabs Stopped by: Felix Pacini       TAKE these medications    fish oil-omega-3 fatty acids 1000 MG capsule Take 1 g by mouth daily.   tiZANidine 4 MG tablet Commonly known as: ZANAFLEX Take 4 mg by mouth at bedtime.        All past medical history, surgical history, allergies, family history, immunizations andmedications were updated in the EMR today and reviewed under the history and medication portions of their EMR.    Recent Results (from the past 2160 hour(s))  Basic metabolic panel     Status: Abnormal   Collection Time: 01/04/23 12:00 AM  Result Value Ref Range   Glucose 82    CO2 25 (A) 13 - 22   Potassium 4.4 3.5 - 5.1 mEq/L   Sodium 138 137 - 147   Chloride 107 99 - 108  Hemoglobin A1c     Status: None   Collection Time: 01/04/23 12:00 AM  Result Value Ref Range   Hemoglobin A1C 4.8     ROS 14 pt review of systems performed and negative (unless mentioned in an HPI)  Objective: BP 129/85   Pulse (!) 55   Temp 97.8 F (36.6 C)   Ht 5\' 5"  (1.651 m)   LMP 12/13/2022   SpO2 97%   BMI 25.79 kg/m  Physical Exam Vitals and nursing note reviewed.  Constitutional:      General: She is not in acute distress.    Appearance: Normal appearance. She is not ill-appearing or toxic-appearing.  HENT:      Head: Normocephalic and atraumatic.     Right Ear: Tympanic membrane, ear canal and external ear normal. There is no impacted cerumen.     Left Ear: Tympanic membrane, ear canal and external ear normal. There is no impacted cerumen.     Nose: No congestion or rhinorrhea.     Mouth/Throat:  Mouth: Mucous membranes are moist.     Pharynx: Oropharynx is clear. No oropharyngeal exudate or posterior oropharyngeal erythema.  Eyes:     General: No scleral icterus.       Right eye: No discharge.        Left eye: No discharge.     Extraocular Movements: Extraocular movements intact.     Conjunctiva/sclera: Conjunctivae normal.     Pupils: Pupils are equal, round, and reactive to light.  Cardiovascular:     Rate and Rhythm: Normal rate and regular rhythm.     Pulses: Normal pulses.     Heart sounds: Normal heart sounds. No murmur heard.    No friction rub. No gallop.  Pulmonary:     Effort: Pulmonary effort is normal. No respiratory distress.     Breath sounds: Normal breath sounds. No stridor. No wheezing, rhonchi or rales.  Chest:     Chest wall: No tenderness.  Abdominal:     General: Abdomen is flat. Bowel sounds are normal. There is no distension.     Palpations: Abdomen is soft. There is no mass.     Tenderness: There is no abdominal tenderness. There is no right CVA tenderness, left CVA tenderness, guarding or rebound.     Hernia: No hernia is present.  Musculoskeletal:        General: No swelling, tenderness or deformity. Normal range of motion.     Cervical back: Normal range of motion and neck supple. No rigidity or tenderness.     Right lower leg: No edema.     Left lower leg: No edema.  Lymphadenopathy:     Cervical: No cervical adenopathy.  Skin:    General: Skin is warm and dry.     Coloration: Skin is not jaundiced or pale.     Findings: No bruising, erythema, lesion or rash.  Neurological:     General: No focal deficit present.     Mental Status: She is alert and  oriented to person, place, and time. Mental status is at baseline.     Cranial Nerves: No cranial nerve deficit.     Sensory: No sensory deficit.     Motor: No weakness.     Coordination: Coordination normal.     Gait: Gait normal.     Deep Tendon Reflexes: Reflexes normal.  Psychiatric:        Mood and Affect: Mood normal.        Behavior: Behavior normal.        Thought Content: Thought content normal.        Judgment: Judgment normal.      Assessment/plan: Alexandra Mcguire is a 52 y.o. female present for re-est/CPE Establishing care with new doctor, encounter for Family history of heart disease - lipid screening at goal 12/2022- reviewed  Breast cancer screening by mammogram - MM 3D SCREENING MAMMOGRAM BILATERAL BREAST; Future Need for zoster vaccination Shingrix #1 provided today Nurse visit for shingrix #2 in 3-6 mos.  Routine general medical examination at a health care facility - reviewed labs from 12/2022 Patient was encouraged to exercise greater than 150 minutes a week. Patient was encouraged to choose a diet filled with fresh fruits and vegetables, and lean meats. AVS provided to patient today for education/recommendation on gender specific health and safety maintenance. Colonoscopy: no fhx. Cologuard last yr- negative, 3 yr repeat 01/2025. Mammogram: No Fhx.  Cervical cancer screening: PAP 2023 at [facility]. Reported normal. 5 yr follow up.   Immunizations: tdap  2018, Influenza (encouraged yearly), shingrix #1 today. Nurse visit in 3-6 mos for #2. Infectious disease screening: HIV completed 2016, hep c- declined DEXA: Never  Return in about 1 year (around 03/21/2024) for cpe (20 min) and nurse visit 3 months for shingrix #2.  Orders Placed This Encounter  Procedures   MM 3D SCREENING MAMMOGRAM BILATERAL BREAST   Varicella-zoster vaccine IM   No orders of the defined types were placed in this encounter.  Referral Orders  No referral(s) requested today     Note  is dictated utilizing voice recognition software. Although note has been proof read prior to signing, occasional typographical errors still can be missed. If any questions arise, please do not hesitate to call for verification.  Electronically signed by: Felix Pacini, DO [facility] [place]

## 2023-03-21 NOTE — Patient Instructions (Addendum)
Return in about 1 year (around 03/21/2024) for cpe (20 min) and nurse visit 3 months for shingrix #2.        Great to see you today.  I have refilled the medication(s) we provide.   If labs were collected or images ordered, we will inform you of  results once we have received them and reviewed. We will contact you either by echart message, or telephone call.  Please give ample time to the testing facility, and our office to run,  receive and review results. Please do not call inquiring of results, even if you can see them in your chart. We will contact you as soon as we are able. If it has been over 1 week since the test was completed, and you have not yet heard from [place], then please call us.    - echart message- for normal results that have been seen by the patient already.   - telephone call: abnormal results or if patient has not viewed results in their echart.  If a referral to a specialist was entered for you, please call us in 2 weeks if you have not heard from the specialist office to schedule.

## 2023-03-24 ENCOUNTER — Ambulatory Visit
Admission: RE | Admit: 2023-03-24 | Discharge: 2023-03-24 | Disposition: A | Discharge status: Home / Self Care | Payer: 59 | Source: Ambulatory Visit

## 2023-03-24 DIAGNOSIS — Z1231 Encounter for screening mammogram for malignant neoplasm of breast: Secondary | ICD-10-CM

## 2023-05-17 ENCOUNTER — Ambulatory Visit: Payer: 59 | Admitting: Family Medicine

## 2023-06-01 ENCOUNTER — Ambulatory Visit: Payer: 59 | Admitting: Family Medicine

## 2023-06-23 ENCOUNTER — Ambulatory Visit: Payer: 59

## 2023-10-21 ENCOUNTER — Telehealth: Payer: 59 | Admitting: Physician Assistant

## 2023-10-21 DIAGNOSIS — J329 Chronic sinusitis, unspecified: Secondary | ICD-10-CM

## 2023-10-21 MED ORDER — AMOXICILLIN-POT CLAVULANATE 875-125 MG PO TABS: 1.0000 | ORAL_TABLET | Freq: Two times a day (BID) | ORAL | 0 refills | Status: AC

## 2023-10-21 NOTE — Progress Notes (Signed)

## 2023-12-16 ENCOUNTER — Telehealth: Admitting: Family Medicine

## 2023-12-16 DIAGNOSIS — M25819 Other specified joint disorders, unspecified shoulder: Secondary | ICD-10-CM

## 2023-12-16 MED ORDER — TIZANIDINE HCL 4 MG PO TABS: 4.0000 mg | ORAL_TABLET | Freq: Three times a day (TID) | ORAL | 0 refills | Status: AC | PRN

## 2023-12-16 MED ORDER — MELOXICAM 15 MG PO TABS: 15.0000 mg | ORAL_TABLET | Freq: Every day | ORAL | 0 refills | Status: AC

## 2023-12-16 NOTE — Progress Notes (Signed)
 Virtual Visit Consent   Alexandra Mcguire, you are scheduled for a virtual visit with a [facility] provider today. Just as with appointments in the office, your consent must be obtained to participate. Your consent will be active for this visit and any virtual visit you may have with one of our providers in the next 365 days. If you have a MyChart account, a copy of this consent can be sent to you electronically.  As this is a virtual visit, video technology does not allow for your provider to perform a traditional examination. This may limit your provider's ability to fully assess your condition. If your provider identifies any concerns that need to be evaluated in person or the need to arrange testing (such as labs, EKG, etc.), we will make arrangements to do so. Although advances in technology are sophisticated, we cannot ensure that it will always work on either your end or our end. If the connection with a video visit is poor, the visit may have to be switched to a telephone visit. With either a video or telephone visit, we are not always able to ensure that we have a secure connection.  By engaging in this virtual visit, you consent to the provision of healthcare and authorize for your insurance to be billed (if applicable) for the services provided during this visit. Depending on your insurance coverage, you may receive a charge related to this service.  I need to obtain your verbal consent now. Are you willing to proceed with your visit today? Alexandra Mcguire has provided verbal consent on 12/16/2023 for a virtual visit (video or telephone). Albertha Huger, FNP  Date: 12/16/2023 11:49 AM   Virtual Visit via Video Note   I, Albertha Huger, connected with  Alexandra Mcguire  ([id number], 02/14/1971) on 12/16/23 at 11:45 AM EDT by a video-enabled telemedicine application and verified that I am speaking with the correct person using two identifiers.  Location: Patient: Virtual Visit Location Patient:  Home Provider: Virtual Visit Location Provider: Home Office   I discussed the limitations of evaluation and management by telemedicine and the availability of in person appointments. The patient expressed understanding and agreed to proceed.    History of Present Illness: Alexandra Mcguire is a 53 y.o. who identifies as a female who was assigned female at birth, and is being seen today for Left shoulder pain and impingement. No flare in a year. She has had surgery and next step is a replacement. She reports mobic  and zanaflex  have worked well in the past. .  HPI: HPI  Problems:  Patient Active Problem List   Diagnosis Date Noted   Need for zoster vaccination 03/21/2023   Family history of heart disease 03/26/2016    Allergies:  Allergies  Allergen Reactions   Sulfa Antibiotics Rash   Medications:  Current Outpatient Medications:    meloxicam  (MOBIC ) 15 MG tablet, Take 1 tablet (15 mg total) by mouth daily., Disp: 30 tablet, Rfl: 0   ASHWAGANDHA PO, Take 1,300 mg by mouth., Disp: , Rfl:    cetirizine (ZYRTEC) 10 MG tablet, Take 10 mg by mouth daily., Disp: , Rfl:    ERGOCALCIFEROL  PO, Take 5,000 Units by mouth once a week., Disp: , Rfl:    fish oil-omega-3 fatty acids 1000 MG capsule, Take 1 g by mouth daily., Disp: , Rfl:    MAGNESIUM GLYCINATE PO, Take 500 mg by mouth., Disp: , Rfl:    Melaton-Thean-Cham-PassF-LBalm (MELATONIN + L-THEANINE) CAPS, Take by mouth.,  Disp: , Rfl:    tiZANidine  (ZANAFLEX ) 4 MG tablet, Take 1 tablet (4 mg total) by mouth every 8 (eight) hours as needed for up to 10 days for muscle spasms., Disp: 30 tablet, Rfl: 0   Turmeric (CURCUMIN 95) 500 MG CAPS, Take by mouth., Disp: , Rfl:   Observations/Objective: Patient is well-developed, well-nourished in no acute distress.  Resting comfortably  at home.  Head is normocephalic, atraumatic.  No labored breathing.  Speech is clear and coherent with logical content.  Patient is alert and oriented at baseline.     Assessment and Plan: 1. Shoulder impingement (Primary)  Heat, follow up with ortho if sx persist or worsen.   Follow Up Instructions: I discussed the assessment and treatment plan with the patient. The patient was provided an opportunity to ask questions and all were answered. The patient agreed with the plan and demonstrated an understanding of the instructions.  A copy of instructions were sent to the patient via MyChart unless otherwise noted below.     The patient was advised to call back or seek an in-person evaluation if the symptoms worsen or if the condition fails to improve as anticipated.    Vincent Streater, FNP

## 2023-12-16 NOTE — Patient Instructions (Signed)
Shoulder Pain Many things can cause shoulder pain, including: An injury to the shoulder. Overuse of the shoulder. Arthritis. The source of the pain can be: Inflammation. An injury to the shoulder joint. An injury to a tendon, ligament, or bone. Follow these instructions at home: Pay attention to changes in your symptoms. Let your health care provider know about them. Follow these instructions to relieve your pain. If you have a removable sling: Wear the sling as told by your provider. Remove it only as told by your provider. Check the skin around the sling every day. Tell your provider about any concerns. Loosen the sling if your fingers tingle, become numb, or become cold. Keep the sling clean. If the sling is not waterproof: Do not let it get wet. Remove it to shower or bathe. Move your arm as little as possible, but keep your hand moving to prevent swelling. Managing pain, stiffness, and swelling  If told, put ice on the painful area. If you have a removable sling or immobilizer, remove it as told by your provider. Put ice in a plastic bag. Place a towel between your skin and the bag. Leave the ice on for 20 minutes, 2-3 times a day. If your skin turns bright red, remove the ice right away to prevent skin damage. The risk of damage is higher if you cannot feel pain, heat, or cold. Move your fingers often to reduce stiffness and swelling. Squeeze a soft ball or a foam pad as much as possible. This helps to keep the shoulder from swelling. It also helps to strengthen the arm. General instructions Take over-the-counter and prescription medicines only as told by your provider. Exercise may help with pain management. Perform exercises if told by your provider. You may be referred to a physical therapist to help in your recovery process. Keep all follow-up visits in order to avoid any type of permanent shoulder disability or chronic pain problems. Contact a health care provider  if: Your pain is not relieved with medicines. New pain develops in your arm, hand, or fingers. You loosen your sling and your arm, hand, or fingers remain tingly, numb, swollen, or painful. Get help right away if: Your arm, hand, or fingers turn white or blue. This information is not intended to replace advice given to you by your health care provider. Make sure you discuss any questions you have with your health care provider. Document Revised: 03/12/2022 Document Reviewed: 03/12/2022 Elsevier Patient Education  2024 [employer]  

## 2024-01-09 ENCOUNTER — Ambulatory Visit: Admitting: Family Medicine

## 2024-01-20 ENCOUNTER — Ambulatory Visit: Admitting: Family Medicine

## 2024-02-07 ENCOUNTER — Ambulatory Visit: Admitting: Family Medicine

## 2024-02-20 ENCOUNTER — Ambulatory Visit (INDEPENDENT_AMBULATORY_CARE_PROVIDER_SITE_OTHER): Payer: 59 | Admitting: Family Medicine

## 2024-02-20 ENCOUNTER — Encounter: Payer: Self-pay | Admitting: Family Medicine

## 2024-02-20 ENCOUNTER — Other Ambulatory Visit: Payer: Self-pay | Admitting: Family Medicine

## 2024-02-20 VITALS — BP 124/84 | HR 73 | Temp 98.1°F | Wt 172.0 lb

## 2024-02-20 DIAGNOSIS — Z8249 Family history of ischemic heart disease and other diseases of the circulatory system: Secondary | ICD-10-CM

## 2024-02-20 DIAGNOSIS — Z1231 Encounter for screening mammogram for malignant neoplasm of breast: Secondary | ICD-10-CM

## 2024-02-20 DIAGNOSIS — Z131 Encounter for screening for diabetes mellitus: Secondary | ICD-10-CM | POA: Diagnosis not present

## 2024-02-20 DIAGNOSIS — Z13220 Encounter for screening for lipoid disorders: Secondary | ICD-10-CM | POA: Diagnosis not present

## 2024-02-20 DIAGNOSIS — Z0000 Encounter for general adult medical examination without abnormal findings: Secondary | ICD-10-CM | POA: Diagnosis not present

## 2024-02-20 DIAGNOSIS — L989 Disorder of the skin and subcutaneous tissue, unspecified: Secondary | ICD-10-CM

## 2024-02-20 LAB — CBC
HCT: 43.7 % (ref 36.0–46.0)
Hemoglobin: 14.7 g/dL (ref 12.0–15.0)
MCHC: 33.5 g/dL (ref 30.0–36.0)
MCV: 94.3 fl (ref 78.0–100.0)
Platelets: 267 10*3/uL (ref 150.0–400.0)
RBC: 4.64 Mil/uL (ref 3.87–5.11)
RDW: 13.4 % (ref 11.5–15.5)
WBC: 4.6 10*3/uL (ref 4.0–10.5)

## 2024-02-20 LAB — COMPREHENSIVE METABOLIC PANEL WITH GFR
ALT: 16 U/L (ref 0–35)
AST: 20 U/L (ref 0–37)
Albumin: 4.3 g/dL (ref 3.5–5.2)
Alkaline Phosphatase: 66 U/L (ref 39–117)
BUN: 21 mg/dL (ref 6–23)
CO2: 24 meq/L (ref 19–32)
Calcium: 9.6 mg/dL (ref 8.4–10.5)
Chloride: 103 meq/L (ref 96–112)
Creatinine, Ser: 0.99 mg/dL (ref 0.40–1.20)
GFR: 65.22 mL/min (ref >60.00)
Glucose, Bld: 78 mg/dL (ref 70–99)
Potassium: 4.6 meq/L (ref 3.5–5.1)
Sodium: 136 meq/L (ref 135–145)
Total Bilirubin: 0.9 mg/dL (ref 0.2–1.2)
Total Protein: 7.6 g/dL (ref 6.0–8.3)

## 2024-02-20 LAB — LIPID PANEL
Cholesterol: 191 mg/dL (ref 0–200)
HDL: 68.3 mg/dL (ref >39.00)
LDL Cholesterol: 114 mg/dL — ABNORMAL HIGH (ref 0–99)
NonHDL: 122.99
Total CHOL/HDL Ratio: 3
Triglycerides: 47 mg/dL (ref 0.0–149.0)
VLDL: 9.4 mg/dL (ref 0.0–40.0)

## 2024-02-20 LAB — HEMOGLOBIN A1C: Hgb A1c MFr Bld: 5.6 % (ref 4.6–6.5)

## 2024-02-20 LAB — TSH: TSH: 2.84 u[IU]/mL (ref 0.35–5.50)

## 2024-02-20 NOTE — Patient Instructions (Signed)
 Return in about 1 year (around 02/20/2025) for cpe (40 min) with pap.        Great to see you today.  I have refilled the medication(s) we provide.   If labs were collected or images ordered, we will inform you of  results once we have received them and reviewed. We will contact you either by echart message, or telephone call.  Please give ample time to the testing facility, and our office to run,  receive and review results. Please do not call inquiring of results, even if you can see them in your chart. We will contact you as soon as we are able. If it has been over 1 week since the test was completed, and you have not yet heard from us , then please call us .    - echart message- for normal results that have been seen by the patient already.   - telephone call: abnormal results or if patient has not viewed results in their echart.  If a referral to a specialist was entered for you, please call us  in 2 weeks if you have not heard from the specialist office to schedule.

## 2024-02-20 NOTE — Progress Notes (Signed)
 Patient ID: Alexandra Mcguire, female  DOB: 1971/06/13, 53 y.o.   MRN: [id number] Patient Care Team    Relationship Specialty Notifications Start End  Tonyetta Charlies LABOR, DO PCP - General Family Medicine  03/21/23   Marget Lenis, MD Consulting Physician Obstetrics and Gynecology  03/26/16     Chief Complaint  Patient presents with   Annual Exam    Pt is not fasting.     Subjective:  Alexandra Mcguire is a 53 y.o.  female present for cpe All past medical history, surgical history, allergies, family history, immunizations, medications and social history were updated in the electronic medical record today. All recent labs, ED visits and hospitalizations within the last year were reviewed.   Health maintenance:  Colonoscopy: no fhx. Cologuard last yr- negative, 3 yr repeat 01/2025. Mammogram: No Fhx. 03/24/2023 [facility]> ordered placed for 2025 Cervical cancer screening: PAP 01/2022 at [facility]. Reported normal. 5 yr follow up.   Immunizations: tdap 2018, Influenza (encouraged yearly), shingrix  completed Infectious disease screening: HIV completed 2016, hep c- declined DEXA: routine screen at 60 Patient has a Dental home. Hospitalizations/ED visits: reviewed     02/20/2024    9:58 AM 03/21/2023   11:38 AM 01/05/2018    9:04 AM 07/13/2016   10:50 AM 03/26/2016   10:08 AM  Depression screen PHQ 2/9  Decreased Interest 1 0 0 0 0  Down, Depressed, Hopeless 1 0 0 0 0  PHQ - 2 Score 2 0 0 0 0  Altered sleeping 1      Tired, decreased energy 0      Change in appetite 0      Feeling bad or failure about yourself  1      Trouble concentrating 0      Moving slowly or fidgety/restless 0      Suicidal thoughts 0      PHQ-9 Score 4      Difficult doing work/chores Somewhat difficult          02/20/2024    9:59 AM  GAD 7 : Generalized Anxiety Score  Nervous, Anxious, on Edge 1  Control/stop worrying 0  Worry too much - different things 0  Trouble relaxing 0  Restless 0  Easily annoyed or  irritable 1  Afraid - awful might happen 0  Total GAD 7 Score 2  Anxiety Difficulty Not difficult at all          02/20/2024    9:58 AM 03/21/2023   11:38 AM 01/05/2018    9:04 AM 03/26/2016   10:08 AM  Fall Risk   Falls in the past year? 0 0 No  No   Number falls in past yr:  0    Injury with Fall?  0    Follow up Falls evaluation completed        Data saved with a previous flowsheet row definition    Immunization History  Administered Date(s) Administered   H1N1 06/12/2008   Hepatitis A 03/18/2004   Hepatitis A, Adult 02/04/2017   Influenza,inj,Quad PF,6+ Mos 05/20/2017   Influenza-Unspecified 05/24/2015, 05/23/2016, 05/23/2018, 05/24/2019   Td 03/18/2004, 08/30/2007   Tdap 08/30/2007, 02/04/2017   Typhoid Inactivated 03/18/2004   Typhoid Live 02/04/2017   Yellow Fever 03/18/2004   Zoster Recombinant(Shingrix ) 03/21/2023, 10/30/2023    No results found.  Past Medical History:  Diagnosis Date   Allergy    Ankle syndesmosis disruption 02/22/2012   Complication of anesthesia    Endometriosis  Fibroadenoma of breast, left    Miscarriage 2009   2009   Nontraumatic tear of supraspinatus tendon, left 08/31/2018   Ovarian cyst    Partial nontraumatic tear of left rotator cuff 03/15/2016   Injection 03/23/2016     PONV (postoperative nausea and vomiting)    Allergies  Allergen Reactions   Sulfa Antibiotics Rash   Past Surgical History:  Procedure Laterality Date   BREAST LUMPECTOMY Left 1989, 1991   fibroadenoma   CESAREAN SECTION  2001   EXPLORATORY LAPAROTOMY  1994   Endometriosis   OOPHORECTOMY Left 1996   ORIF ANKLE FRACTURE  02/21/2012   Procedure: OPEN REDUCTION INTERNAL FIXATION (ORIF) ANKLE FRACTURE;  Surgeon: Fonda SHAUNNA Olmsted, MD;  Location: [facility] OR;  Service: Orthopedics;  Laterality: Right;   SHOULDER ARTHROSCOPY Right 2012   SHOULDER ARTHROSCOPY WITH ROTATOR CUFF REPAIR AND SUBACROMIAL DECOMPRESSION Left 08/31/2018   Procedure: SHOULDER ARTHROSCOPY  WITH ROTATOR CUFF REPAIR AND SUBACROMIAL DECOMPRESSION;  Surgeon: Olmsted Fonda, MD;  Location: [facility];  Service: Orthopedics;  Laterality: Left;   Family History  Problem Relation Age of Onset   Heart disease Mother    Arthritis Mother    Hypertension Mother    Fibroids Mother    Hyperlipidemia Mother    Cancer Father        sarcoma (abdominal)   Mental illness Father    Heart disease Father    Hypertension Father    Alcohol abuse Father    Hyperlipidemia Father    Miscarriages / [place] Sister    Depression Sister    Drug abuse Brother    Hypertension Brother    Arthritis Maternal Grandmother    Heart disease Maternal Grandmother    Heart disease Maternal Grandfather    Early death Maternal Grandfather    Hypertension Maternal Grandfather    Heart disease Paternal Grandmother    Depression Paternal Grandmother    Hypertension Paternal Grandmother    Heart disease Paternal Grandfather    Early death Paternal Grandfather    Alcohol abuse Paternal Grandfather    Birth defects Daughter    Learning disabilities Daughter    Social History   Social History Narrative   [place]onal to [place]. 2 children named [place] and [employer].   Masters degree, employed as a Agricultural consultant NP   Wears her seatbelt, wears a bicycle helmet, smoke detector in the home.   Exercises routinely.   Feels safe in her  relationships.    Allergies as of 02/20/2024       Reactions   Sulfa Antibiotics Rash        Medication List        Accurate as of February 20, 2024 10:56 AM. If you have any questions, ask your nurse or doctor.          ASHWAGANDHA PO Take 1,300 mg by mouth.   cetirizine 10 MG tablet Commonly known as: ZYRTEC Take 10 mg by mouth daily.   Curcumin 95 500 MG Caps Generic drug: Turmeric Take by mouth.   ERGOCALCIFEROL  PO Take 5,000 Units by mouth once a week.   fish oil-omega-3 fatty acids 1000 MG capsule Take 1 g by mouth daily.   MAGNESIUM  GLYCINATE PO Take 500 mg by mouth.   Melatonin + L-Theanine Caps Take by mouth.   meloxicam  15 MG tablet Commonly known as: MOBIC  Take 1 tablet (15 mg total) by mouth daily.        All past medical history, surgical history, allergies,  family history, immunizations andmedications were updated in the EMR today and reviewed under the history and medication portions of their EMR.    No results found for this or any previous visit (from the past 2160 hours).   ROS 14 pt review of systems performed and negative (unless mentioned in an HPI)  Objective: BP 124/84   Pulse 73   Temp 98.1 F (36.7 C)   Wt 172 lb (78 kg)   LMP 01/02/2024   SpO2 98%   BMI 28.62 kg/m  Physical Exam Vitals and nursing note reviewed.  Constitutional:      General: She is not in acute distress.    Appearance: Normal appearance. She is not ill-appearing or toxic-appearing.  HENT:     Head: Normocephalic and atraumatic.     Right Ear: Tympanic membrane, ear canal and external ear normal. There is no impacted cerumen.     Left Ear: Tympanic membrane, ear canal and external ear normal. There is no impacted cerumen.     Nose: No congestion or rhinorrhea.     Mouth/Throat:     Mouth: Mucous membranes are moist.     Pharynx: Oropharynx is clear. No oropharyngeal exudate or posterior oropharyngeal erythema.   Eyes:     General: No scleral icterus.       Right eye: No discharge.        Left eye: No discharge.     Extraocular Movements: Extraocular movements intact.     Conjunctiva/sclera: Conjunctivae normal.     Pupils: Pupils are equal, round, and reactive to light.    Cardiovascular:     Rate and Rhythm: Normal rate and regular rhythm.     Pulses: Normal pulses.     Heart sounds: Normal heart sounds. No murmur heard.    No friction rub. No gallop.  Pulmonary:     Effort: Pulmonary effort is normal. No respiratory distress.     Breath sounds: Normal breath sounds. No stridor. No wheezing,  rhonchi or rales.  Chest:     Chest wall: No tenderness.  Abdominal:     General: Abdomen is flat. Bowel sounds are normal. There is no distension.     Palpations: Abdomen is soft. There is no mass.     Tenderness: There is no abdominal tenderness. There is no right CVA tenderness, left CVA tenderness, guarding or rebound.     Hernia: No hernia is present.   Musculoskeletal:        General: No swelling, tenderness or deformity. Normal range of motion.     Cervical back: Normal range of motion and neck supple. No rigidity or tenderness.     Right lower leg: No edema.     Left lower leg: No edema.  Lymphadenopathy:     Cervical: No cervical adenopathy.   Skin:    General: Skin is warm and dry.     Coloration: Skin is not jaundiced or pale.     Findings: Lesion (1.4cm x 1.1 cm flat abnormal shape hyperpigmented  skin lesion right forearm) present. No bruising, erythema or rash.   Neurological:     General: No focal deficit present.     Mental Status: She is alert and oriented to person, place, and time. Mental status is at baseline.     Cranial Nerves: No cranial nerve deficit.     Sensory: No sensory deficit.     Motor: No weakness.     Coordination: Coordination normal.     Gait: Gait normal.  Deep Tendon Reflexes: Reflexes normal.   Psychiatric:        Mood and Affect: Mood normal.        Behavior: Behavior normal.        Thought Content: Thought content normal.        Judgment: Judgment normal.      Assessment/plan: DONEEN OLLINGER is a 53 y.o. female present for CPE Family history of heart disease - Lipid panel Lipid screening - Lipid panel Diabetes mellitus screening - Hemoglobin A1c Breast cancer screening by mammogram - MM 3D SCREENING MAMMOGRAM BILATERAL BREAST; Future Skin lesion - Ambulatory referral to Dermatology possibly basal vs sun/age spot. Pt reports it has become bigger over the last year.  Derm referral placed.  Routine general medical  examination at a health care facility (Primary) - CBC - Comprehensive metabolic panel with GFR - TSH Patient was encouraged to exercise greater than 150 minutes a week. Patient was encouraged to choose a diet filled with fresh fruits and vegetables, and lean meats. AVS provided to patient today for education/recommendation on gender specific health and safety maintenance. Colonoscopy: no fhx. Cologuard last yr- negative, 3 yr repeat 01/2025. Mammogram: No Fhx. 03/24/2023 [facility]> ordered placed for 2025 Cervical cancer screening: PAP 01/2022 at [facility]. Reported normal. 5 yr follow up.   Immunizations: tdap 2018, Influenza (encouraged yearly), shingrix  completed Infectious disease screening: HIV completed 2016, hep c- declined DEXA: routine screen at 60     Return in about 1 year (around 02/20/2025) for cpe (40 min) with pap.  Orders Placed This Encounter  Procedures   MM 3D SCREENING MAMMOGRAM BILATERAL BREAST   CBC   Comprehensive metabolic panel with GFR   Hemoglobin A1c   Lipid panel   TSH   Ambulatory referral to Dermatology   No orders of the defined types were placed in this encounter.  Referral Orders         Ambulatory referral to Dermatology       Note is dictated utilizing voice recognition software. Although note has been proof read prior to signing, occasional typographical errors still can be missed. If any questions arise, please do not hesitate to call for verification.  Electronically signed by: Charlies Bellini, DO [facility]

## 2024-02-21 ENCOUNTER — Ambulatory Visit: Payer: Self-pay | Admitting: Family Medicine

## 2024-03-23 ENCOUNTER — Encounter

## 2024-03-26 ENCOUNTER — Encounter

## 2024-04-02 ENCOUNTER — Ambulatory Visit

## 2024-06-12 ENCOUNTER — Ambulatory Visit: Admitting: Dermatology

## 2025-02-05 ENCOUNTER — Encounter: Admitting: Family Medicine
